# Patient Record
Sex: Male | Born: 1950 | Race: White | Hispanic: No | Marital: Single | Smoking: Current some day smoker
Health system: Southern US, Community
[De-identification: ages and names within clinical notes are randomized; demographics above are authoritative.]

## PROBLEM LIST (undated history)

## (undated) DIAGNOSIS — I10 Essential (primary) hypertension: Principal | ICD-10-CM

## (undated) DIAGNOSIS — E785 Hyperlipidemia, unspecified: Principal | ICD-10-CM

## (undated) DIAGNOSIS — R072 Precordial pain: Secondary | ICD-10-CM

## (undated) HISTORY — DX: Precordial pain: R07.2

## (undated) HISTORY — DX: Hyperlipidemia, unspecified: E78.5

## (undated) HISTORY — DX: Essential (primary) hypertension: I10

---

## 2015-05-04 ENCOUNTER — Ambulatory Visit (INDEPENDENT_AMBULATORY_CARE_PROVIDER_SITE_OTHER): Payer: Self-pay | Admitting: Cardiovascular Disease

## 2015-05-04 ENCOUNTER — Encounter: Payer: Self-pay | Admitting: Cardiovascular Disease

## 2015-05-04 VITALS — BP 162/98 | HR 77 | Ht 67.0 in | Wt 187.4 lb

## 2015-05-04 DIAGNOSIS — R072 Precordial pain: Secondary | ICD-10-CM

## 2015-05-04 DIAGNOSIS — I10 Essential (primary) hypertension: Secondary | ICD-10-CM

## 2015-05-04 DIAGNOSIS — R0602 Shortness of breath: Secondary | ICD-10-CM

## 2015-05-04 HISTORY — DX: Precordial pain: R07.2

## 2015-05-04 HISTORY — DX: Essential (primary) hypertension: I10

## 2015-05-04 MED ORDER — LOSARTAN POTASSIUM 50 MG PO TABS: 50.0000 mg | ORAL_TABLET | Freq: Every day | ORAL | Status: DC

## 2015-05-04 NOTE — Patient Instructions (Signed)
Your physician recommends that you schedule a follow-up appointment in [place].  START LOSARTAN 50 MG ONE TABLET BY MOUTH DAILY   LABS-- CMP ,CBC, LIPID - DO NOT EAT OR DRINK THE MORNING OF TEST.  Your physician has requested that you have en exercise stress myoview. For further information please visit [email or web address]. Please follow instruction sheet, as given.  WILL CONTACT YOU WITH RESULTS.   If you need a refill on your cardiac medications before your next appointment, please call your pharmacy.

## 2015-05-04 NOTE — Progress Notes (Signed)
Cardiology Office Note   Date:  05/04/2015   ID:  Johnny Barnes, DOB March 02, 1951, MRN [id number]  PCP:  Johnny Barnes  Cardiologist:   Sharol Harness, MD   Chief Complaint  Barnes presents with  . New Evaluation    pt c/o sharp chest pain on left side of chest last month/was intermittent over a couple of weeks/ went to Dr. and had very high BP/this past Saturday, was drinking beer, he had a bad pain on the top of his head, went away and came back the next day with some lightheadedness. Has not been as bad since then.//pt states Johnny other Sx.      History of Present Illness: Johnny Barnes is a 64 y.o. male with hypertension who presents for follow up on hypertension.  He has known that his blood presure was high for 15-20 years.  Last month he had an episode of L sided chest pain that was mostly dull but also occasionally sharp.  That continued intermittently for a couple of weeks.  The episodes last for a few seconds at a time and are associated with shortness of breath. There is Johnny associated nausea or diaphoresis. Since then he has checked his blood pressure several times and it has been consistently elevated. He saw his urologist in his blood pressure was 190/90. Since then he tried to cut back on his salt intake and has started feeling much better. However he has a persistent right-sided headache that seems to be worse when he drinks alcohol. He endorses drinking up to a half gallon of beer on the weekends.  He has noted episodes of vertigo that he associates with the headache and hypertension. This caused him to stop drinking for the last week. He states that he has never been on any medication for his blood pressure. Most people in his family have high blood pressure.  Johnny Barnes reports that his diet is pretty good. He does not eat much. He drinks coffee in the morning followed by mostly water throughout the day. In the evenings he has beer. He doesn't get any formal  exercise but does do yard work. He doesn't have any chest pain or pressure when he does this.  Past Medical History  Diagnosis Date  . Essential hypertension 05/04/2015  . Chest pain, precordial 05/04/2015    History reviewed. Johnny pertinent past surgical history.   Current Outpatient Prescriptions  Medication Sig Dispense Refill  . losartan (COZAAR) 50 MG tablet Take 1 tablet (50 mg total) by mouth daily. 30 tablet 6   Johnny current facility-administered medications for this visit.    Allergies:   Aspirin    Social History:  The Barnes  reports that he has been smoking Cigars.  He does not have any smokeless tobacco history on file. He reports that he drinks alcohol. He reports that he uses illicit drugs.   Family History:  The Barnes's family history includes Hypertension in his father and mother.    ROS:  Please see the history of present illness.   Otherwise, review of systems are positive for none.   All other systems are reviewed and negative.    PHYSICAL EXAM: VS:  BP 162/98 mmHg  Pulse 77  Ht 5\' 7"  (1.702 m)  Wt 85.004 kg (187 lb 6.4 oz)  BMI 29.34 kg/m2 , BMI Body mass index is 29.34 kg/(m^2). GENERAL:  Well appearing HEENT:  Pupils equal round and reactive, fundi not visualized, oral mucosa unremarkable  NECK:  Johnny jugular venous distention, waveform within normal limits, carotid upstroke brisk and symmetric, Johnny bruits, Johnny thyromegaly LYMPHATICS:  Johnny cervical adenopathy LUNGS:  Clear to auscultation bilaterally HEART:  RRR.  PMI not displaced or sustained,S1 and S2 within normal limits, Johnny S3, Johnny S4, Johnny clicks, Johnny rubs, Johnny murmurs ABD:  Flat, positive bowel sounds normal in frequency in pitch, Johnny bruits, Johnny rebound, Johnny guarding, Johnny midline pulsatile mass, Johnny hepatomegaly, Johnny splenomegaly EXT:  2 plus pulses throughout, Johnny edema, Johnny cyanosis Johnny clubbing SKIN:  Johnny rashes Johnny nodules NEURO:  Cranial nerves II through XII grossly intact, motor grossly intact  throughout PSYCH:  Cognitively intact, oriented to person place and time    EKG:  EKG is ordered today. The ekg ordered today demonstrates sinus rhythm rate 77 bpm right bundle branch block.   Recent Labs: Johnny results found for requested labs within last 365 days.    Lipid Panel Johnny results found for: CHOL, TRIG, HDL, CHOLHDL, VLDL, LDLCALC, LDLDIRECT    Wt Readings from Last 3 Encounters:  05/04/15 85.004 kg (187 lb 6.4 oz)      ASSESSMENT AND PLAN:  # Chest pain: Johnny Barnes pain is atypical. He does have risk factors including age and untreated hypertension. He also continues to smoke cigars, though he reports that he does not inhale. We will obtain an exercise Myoview to evaluate for ischemia. We will also obtain fasting lipids  # Hypertension:  Blood pressure is elevated. He has failed last modifications and reducing his salt intake. We will start losartan 50 mg daily. We will also check a constant perhaps metabolic panel to assess his kidney function and electrolytes.   # CV Disease Prevention: Check fasting lipid panel.   # EtOH: Johnny Barnes endorses heavy alcohol use. We explained that the recommendations are Johnny more than 2 drinks an ascending 14 one week we will check a comprehensive metabolic panel to assess his liver function.  He denies any history of delirium tremens.  Current medicines are reviewed at length with the Barnes today.  The Barnes does not have concerns regarding medicines.  The following changes have been made:  Start losartan 50 mg daily.  Labs/ tests ordered today include:   Orders Placed This Encounter  Procedures  . CBC  . Comprehensive metabolic panel  . Lipid panel  . Myocardial Perfusion Imaging  . EKG 12-Lead     Disposition:   FU with Anniyah Mood C. Oval Linsey, MD in 1 month to check his blood pressure and electrolytes.    Signed, Sharol Harness, MD  05/04/2015 5:38 PM    [place]

## 2015-05-05 LAB — COMPREHENSIVE METABOLIC PANEL
ALK PHOS: 52 U/L (ref 40–115)
ALT: 20 U/L (ref 9–46)
AST: 19 U/L (ref 10–35)
Albumin: 4.6 g/dL (ref 3.6–5.1)
BUN: 13 mg/dL (ref 7–25)
CO2: 26 mmol/L (ref 20–31)
CREATININE: 0.99 mg/dL (ref 0.70–1.25)
Calcium: 9.5 mg/dL (ref 8.6–10.3)
Chloride: 105 mmol/L (ref 98–110)
Glucose, Bld: 87 mg/dL (ref 65–99)
Potassium: 4.8 mmol/L (ref 3.5–5.3)
SODIUM: 142 mmol/L (ref 135–146)
TOTAL PROTEIN: 7.2 g/dL (ref 6.1–8.1)
Total Bilirubin: 0.8 mg/dL (ref 0.2–1.2)

## 2015-05-05 LAB — LIPID PANEL
CHOLESTEROL: 227 mg/dL — AB (ref 125–200)
HDL: 92 mg/dL (ref >=40)
LDL Cholesterol: 121 mg/dL (ref <130)
Total CHOL/HDL Ratio: 2.5 Ratio (ref <=5.0)
Triglycerides: 70 mg/dL (ref <150)
VLDL: 14 mg/dL (ref <30)

## 2015-05-05 LAB — CBC
HCT: 44.6 % (ref 39.0–52.0)
Hemoglobin: 15.5 g/dL (ref 13.0–17.0)
MCH: 30.9 pg (ref 26.0–34.0)
MCHC: 34.8 g/dL (ref 30.0–36.0)
MCV: 88.8 fL (ref 78.0–100.0)
MPV: 9.6 fL (ref 8.6–12.4)
PLATELETS: 228 10*3/uL (ref 150–400)
RBC: 5.02 MIL/uL (ref 4.22–5.81)
RDW: 13 % (ref 11.5–15.5)
WBC: 6.2 10*3/uL (ref 4.0–10.5)

## 2015-05-09 ENCOUNTER — Telehealth: Payer: Self-pay | Admitting: *Deleted

## 2015-05-09 ENCOUNTER — Telehealth (HOSPITAL_COMMUNITY): Payer: Self-pay

## 2015-05-09 DIAGNOSIS — E785 Hyperlipidemia, unspecified: Secondary | ICD-10-CM

## 2015-05-09 DIAGNOSIS — Z79899 Other long term (current) drug therapy: Secondary | ICD-10-CM

## 2015-05-09 NOTE — Telephone Encounter (Signed)
Encounter complete. 

## 2015-05-09 NOTE — Telephone Encounter (Signed)
-----   Message from Skeet Latch, MD sent at 05/09/2015  7:52 AM EST ----- Kidney function, liver function, electrolytes and blood counts are normal.  However, cholesterol is elevated.  I recommend increased physical activity and improved diets (decrease fried, fatty foods, red meat, increase green, leafy vegetables and fruits).  We will repeat this in 3 months and if still elevated, we should start a cholesterol medication.

## 2015-05-09 NOTE — Telephone Encounter (Signed)
LEFT MESSAGE TO CALL BACK- LABS

## 2015-05-09 NOTE — Telephone Encounter (Signed)
Spoke to patient. Result given . Verbalized understanding WILL MAIL LABSLIP IN JAN 2017

## 2015-05-10 ENCOUNTER — Telehealth (HOSPITAL_COMMUNITY): Payer: Self-pay

## 2015-05-10 NOTE — Telephone Encounter (Signed)
Encounter complete. 

## 2015-05-11 ENCOUNTER — Ambulatory Visit (HOSPITAL_COMMUNITY)
Admission: RE | Admit: 2015-05-11 | Discharge: 2015-05-11 | Disposition: A | Discharge status: Home / Self Care | Payer: Self-pay | Source: Ambulatory Visit | Attending: Cardiology | Admitting: Cardiology

## 2015-05-11 DIAGNOSIS — I10 Essential (primary) hypertension: Secondary | ICD-10-CM

## 2015-05-11 DIAGNOSIS — R0602 Shortness of breath: Secondary | ICD-10-CM

## 2015-05-11 DIAGNOSIS — R072 Precordial pain: Secondary | ICD-10-CM

## 2015-05-11 LAB — MYOCARDIAL PERFUSION IMAGING
CHL CUP NUCLEAR SDS: 1
CHL CUP NUCLEAR SRS: 3
CHL CUP NUCLEAR SSS: 4
CSEPHR: 94 %
CSEPPHR: 148 {beats}/min
Estimated workload: 7 METS
Exercise duration (min): 6 min
Exercise duration (sec): 37 s
LVDIAVOL: 103 mL
LVSYSVOL: 43 mL
MPHR: 157 {beats}/min
NUC STRESS TID: 0.89
RPE: 17
Rest HR: 64 {beats}/min

## 2015-05-11 MED ORDER — TECHNETIUM TC 99M SESTAMIBI GENERIC - CARDIOLITE
11.0000 | Freq: Once | INTRAVENOUS | Status: AC | PRN
  Administered 2015-05-11: 11 via INTRAVENOUS

## 2015-05-11 MED ORDER — TECHNETIUM TC 99M SESTAMIBI GENERIC - CARDIOLITE
32.6000 | Freq: Once | INTRAVENOUS | Status: AC | PRN
  Administered 2015-05-11: 32.6 via INTRAVENOUS

## 2015-05-15 ENCOUNTER — Ambulatory Visit: Payer: Self-pay | Admitting: Cardiovascular Disease

## 2015-05-16 ENCOUNTER — Telehealth: Payer: Self-pay | Admitting: *Deleted

## 2015-05-16 NOTE — Telephone Encounter (Signed)
-----   Message from Skeet Latch, MD sent at 05/13/2015  6:53 AM EST ----- Low risk study.

## 2015-05-16 NOTE — Telephone Encounter (Signed)
Spoke to patient. Result given . Verbalized understanding  

## 2015-06-13 ENCOUNTER — Encounter: Payer: Self-pay | Admitting: Cardiovascular Disease

## 2015-06-13 ENCOUNTER — Ambulatory Visit (INDEPENDENT_AMBULATORY_CARE_PROVIDER_SITE_OTHER): Payer: Self-pay | Admitting: Cardiovascular Disease

## 2015-06-13 VITALS — BP 140/86 | HR 88 | Ht 67.0 in | Wt 180.9 lb

## 2015-06-13 DIAGNOSIS — E785 Hyperlipidemia, unspecified: Secondary | ICD-10-CM

## 2015-06-13 DIAGNOSIS — I10 Essential (primary) hypertension: Secondary | ICD-10-CM

## 2015-06-13 HISTORY — DX: Hyperlipidemia, unspecified: E78.5

## 2015-06-13 NOTE — Progress Notes (Signed)
Cardiology Office Note   Date:  06/13/2015   ID:  Johnny Barnes, DOB 03/02/1951, MRN [id number]  PCP:  No PCP Per Patient  Cardiologist:   Sharol Harness, MD   Chief Complaint  Patient presents with  . Follow-up    pt has no complaints   . Hypertension     Patient ID: Johnny Barnes is a 64 y.o. male with hypertension who presents for follow up on hypertension.     Interval History 06/13/15: Johnny Barnes underwent nuclear stress testing that was negative for ischemia. It did show a hypertensive response to exercise.  It also showed a fixed apical septal and apical inferior wall defect that were thought to be defect from right bundle branch block.   At his last appointment it was also recommended that he increase his physical activity and improve his diet, as his cholesterol levels were elevated.  He was started on losartan for hypertension.  Since that appointment he has been limiting salt intake and he quit drinking alcohol.  He denies any recurrent chest pain or any shortness of breath.  He has made other improvements in his diet including eating raw nuts,vegetables, and fruits. He has not started exercising yet. When the weather is warm he likes to work in the yard. He has not been any formal exercise lately.  He has been tolerating losartan without complications.   History of Present Illness 05/04/15: He has known that his blood presure was high for 15-20 years.  Last month he had an episode of L sided chest pain that was mostly dull but also occasionally sharp.  That continued intermittently for a couple of weeks.  The episodes last for a few seconds at a time and are associated with shortness of breath. There is no associated nausea or diaphoresis. Since then he has checked his blood pressure several times and it has been consistently elevated. He saw his urologist in his blood pressure was 190/90. Since then he tried to cut back on his salt intake and has started feeling much  better. However he has a persistent right-sided headache that seems to be worse when he drinks alcohol. He endorses drinking up to a half gallon of beer on the weekends.  He has noted episodes of vertigo that he associates with the headache and hypertension. This caused him to stop drinking for the last week. He states that he has never been on any medication for his blood pressure. Most people in his family have high blood pressure.  Johnny Barnes reports that his diet is pretty good. He does not eat much. He drinks coffee in the morning followed by mostly water throughout the day. In the evenings he has beer. He doesn't get any formal exercise but does do yard work. He doesn't have any chest pain or pressure when he does this.  Past Medical History  Diagnosis Date  . Essential hypertension 05/04/2015  . Chest pain, precordial 05/04/2015  . Hyperlipidemia 06/13/2015    No past surgical history on file.   Current Outpatient Prescriptions  Medication Sig Dispense Refill  . losartan (COZAAR) 50 MG tablet Take 1 tablet (50 mg total) by mouth daily. 30 tablet 6   No current facility-administered medications for this visit.    Allergies:   Aspirin    Social History:  The patient  reports that he has been smoking Cigars.  He does not have any smokeless tobacco history on file. He reports that he drinks alcohol. He  reports that he uses illicit drugs.   Family History:  The patient's family history includes Hypertension in his father and mother.    ROS:  Please see the history of present illness.   Otherwise, review of systems are positive for none.   All other systems are reviewed and negative.    PHYSICAL EXAM: VS:  BP 140/86 mmHg  Pulse 88  Ht 5\' 7"  (1.702 m)  Wt 82.056 kg (180 lb 14.4 oz)  BMI 28.33 kg/m2 , BMI Body mass index is 28.33 kg/(m^2). GENERAL:  Well appearing HEENT:  Pupils equal round and reactive, fundi not visualized, oral mucosa unremarkable NECK:  No jugular venous  distention, waveform within normal limits, carotid upstroke brisk and symmetric, no bruits, no thyromegaly LYMPHATICS:  No cervical adenopathy LUNGS:  Clear to auscultation bilaterally HEART:  RRR.  PMI not displaced or sustained,S1 and S2 within normal limits, no S3, no S4, no clicks, no rubs, no murmurs ABD:  Flat, positive bowel sounds normal in frequency in pitch, no bruits, no rebound, no guarding, no midline pulsatile mass, no hepatomegaly, no splenomegaly EXT:  2 plus pulses throughout, no edema, no cyanosis no clubbing SKIN:  No rashes no nodules NEURO:  Cranial nerves II through XII grossly intact, motor grossly intact throughout PSYCH:  Cognitively intact, oriented to person place and time    EKG:  EKG is not ordered today.  Exercise Cardiolite 05/11/15:   Nuclear stress EF: 58%.  The left ventricular ejection fraction is normal (55-65%).  Blood pressure demonstrated a hypertensive response to exercise.  There was no ST segment deviation noted during stress.  No T wave inversion was noted during stress.  Small size and intensity, fixed apical septal and apical inferior wall defect, likely artifact from RBBB. No reversible ischemia. LVEF 58%. This is a low risk study.  Recent Labs: 05/04/2015: ALT 20; BUN 13; Creat 0.99; Hemoglobin 15.5; Platelets 228; Potassium 4.8; Sodium 142    Lipid Panel    Component Value Date/Time   CHOL 227* 05/04/2015 0919   TRIG 70 05/04/2015 0919   HDL 92 05/04/2015 0919   CHOLHDL 2.5 05/04/2015 0919   VLDL 14 05/04/2015 0919   LDLCALC 121 05/04/2015 0919      Wt Readings from Last 3 Encounters:  06/13/15 82.056 kg (180 lb 14.4 oz)  05/11/15 84.823 kg (187 lb)  05/04/15 85.004 kg (187 lb 6.4 oz)      ASSESSMENT AND PLAN:  # Hypertension:  Blood pressure is well-controlled today.  He has been limiting salt intake and no longer drinks alcohol.  Continue losartan 50 mg daily.  # Chest pain: Negative stress test.  Symptoms have  resolved.  # CV Disease Prevention: ASCVD 10 year risk is 11.2%.  He is working on diet and exercise.  He is doing well on her diet but has yet to start exercising. We will repeat his fasting lipid panel in February. If his lipids remain elevated we will starta moderate to high intensity statin.  He has been intolerant of aspirin in the past, as it caused his stomach to burn.  # EtOH: Mr. Constantinides previously endorsed heavy alcohol use. He has been abstaining from alcohol since our last appointment.  He was congratulated on his efforts.  Current medicines are reviewed at length with the patient today.  The patient does not have concerns regarding medicines.  The following changes have been made:  None  Labs/ tests ordered today include:   No orders of the  defined types were placed in this encounter.     Disposition:   FU with Brynlie Daza C. Oval Linsey, MD in 1 year.  Lipid panel in 2 months.   Signed, Sharol Harness, MD  06/13/2015 2:05 PM    [place]

## 2015-06-13 NOTE — Patient Instructions (Signed)
NO CHANGE WITH CURRENT TREATMENT   WILL MAIL LABSLIP IN JAN 2017 FOR LAB IN FEB 2017    Your physician wants you to follow-up in 12 MONTHS DR Strang.  You will receive a reminder letter in the mail two months in advance. If you don't receive a letter, please call our office to schedule the follow-up appointment.   If you need a refill on your cardiac medications before your next appointment, please call your pharmacy.

## 2015-07-17 ENCOUNTER — Telehealth: Payer: Self-pay | Admitting: *Deleted

## 2015-07-17 DIAGNOSIS — Z79899 Other long term (current) drug therapy: Secondary | ICD-10-CM

## 2015-07-17 DIAGNOSIS — E785 Hyperlipidemia, unspecified: Secondary | ICD-10-CM

## 2015-07-17 NOTE — Telephone Encounter (Signed)
MAIL LETTER AND LAB SLIP DUE BEFORE 08/04/15

## 2015-07-17 NOTE — Telephone Encounter (Signed)
-----   Message from Raiford Simmonds, RN sent at 05/09/2015  3:31 PM EST ----- MAIL IN [place] 2017 LIPID

## 2015-08-04 LAB — LIPID PANEL
CHOL/HDL RATIO: 3.6 ratio (ref <=5.0)
Cholesterol: 247 mg/dL — ABNORMAL HIGH (ref 125–200)
HDL: 69 mg/dL (ref >=40)
LDL CALC: 164 mg/dL — AB (ref <130)
Triglycerides: 68 mg/dL (ref <150)
VLDL: 14 mg/dL (ref <30)

## 2015-08-11 ENCOUNTER — Telehealth: Payer: Self-pay | Admitting: *Deleted

## 2015-08-11 DIAGNOSIS — E785 Hyperlipidemia, unspecified: Secondary | ICD-10-CM

## 2015-08-11 DIAGNOSIS — Z79899 Other long term (current) drug therapy: Secondary | ICD-10-CM

## 2015-08-11 NOTE — Telephone Encounter (Signed)
Left message to call back In regards to lab work

## 2015-08-11 NOTE — Telephone Encounter (Signed)
-----   Message from Skeet Latch, MD sent at 08/09/2015  7:04 PM EST ----- Cholesterol levels are worse now than 3 months ago.  Recommend starting atorvastatin 40 mg daily.  Repeat lipids and LFTs in 6 weeks.

## 2015-08-11 NOTE — Telephone Encounter (Signed)
Spoke to patient Result given. Patient verbalizing understanding. Patient states he does not want to take atorvastatin at present time. Would to repeat prior to next appointment in 05/2016. Will mail lab slip

## 2015-08-16 ENCOUNTER — Telehealth: Payer: Self-pay | Admitting: Cardiovascular Disease

## 2015-08-16 DIAGNOSIS — E785 Hyperlipidemia, unspecified: Secondary | ICD-10-CM

## 2015-08-16 DIAGNOSIS — Z79899 Other long term (current) drug therapy: Secondary | ICD-10-CM

## 2015-08-16 MED ORDER — ATORVASTATIN CALCIUM 40 MG PO TABS: 40.0000 mg | ORAL_TABLET | Freq: Every day | ORAL | Status: DC

## 2015-08-16 NOTE — Telephone Encounter (Signed)
Spoke to patient  He decide to start  Atorvastatin 40 mg qhs and labs 6 weeks labs  patient aware and will have done in 6 weeks

## 2015-08-16 NOTE — Telephone Encounter (Signed)
Pt says he have decided to take the Cholesterol medicine.

## 2015-08-24 ENCOUNTER — Telehealth: Payer: Self-pay | Admitting: Cardiovascular Disease

## 2015-08-24 NOTE — Telephone Encounter (Signed)
Please schedule follow up in 2 months.  Repeat lipids 2-7 days prior to his follow up appointment.

## 2015-08-24 NOTE — Telephone Encounter (Signed)
Spoke with pt, he is not going to start the new cholesterol, he does not like the side effects. He reports he is going to make additional changes to his diet. He wants to know when to do lab work again. Will forward to dr Oval Linsey and Ivin Booty

## 2015-08-24 NOTE — Telephone Encounter (Signed)
New message ° ° ° ° ° °Talk to the nurse.  He would not tell me what he wanted °

## 2015-08-25 NOTE — Telephone Encounter (Signed)
Spoke to patient  Will try diet control Labs will be done latter part of April Appointment schedule 10/26/15 at 1:30 pm

## 2015-10-20 LAB — HEPATIC FUNCTION PANEL
ALBUMIN: 4.3 g/dL (ref 3.6–5.1)
ALK PHOS: 54 U/L (ref 40–115)
ALT: 13 U/L (ref 9–46)
AST: 14 U/L (ref 10–35)
Bilirubin, Direct: 0.1 mg/dL (ref <=0.2)
Indirect Bilirubin: 0.5 mg/dL (ref 0.2–1.2)
TOTAL PROTEIN: 7 g/dL (ref 6.1–8.1)
Total Bilirubin: 0.6 mg/dL (ref 0.2–1.2)

## 2015-10-20 LAB — LIPID PANEL
Cholesterol: 207 mg/dL — ABNORMAL HIGH (ref 125–200)
HDL: 66 mg/dL (ref >=40)
LDL Cholesterol: 128 mg/dL (ref <130)
TRIGLYCERIDES: 67 mg/dL (ref <150)
Total CHOL/HDL Ratio: 3.1 Ratio (ref <=5.0)
VLDL: 13 mg/dL (ref <30)

## 2015-10-25 NOTE — Progress Notes (Signed)
Cardiology Office Note   Date:  10/26/2015   ID:  Johnny Barnes, DOB 02-10-1951, MRN [id number]  PCP:  No PCP Per Patient  Cardiologist:   Skeet Latch, MD   Chief Complaint  Patient presents with  . Follow-up    labs  pt states he has not been sleeping well--takes about 2 hours to go to sleep at night, not sure if from losartan, not taking lipitor     History of Present Illness: Johnny Barnes is a 65 y.o. male with hypertension who presents for follow up on hypertension.  He has known that his blood presure was high for 15-20 years.  Mr. Pickell noted chest discomfort and underwent nuclear stress testing 05/11/15 that was negative for ischemia. It did show a hypertensive response to exercise.  He was started on losartan and we recommended lifestyle interventions.  He previously drank alcohol heavily but started to abstain from alcohol. Cholesterol levels were checked after 3 months of lifestyle interventions. However his cholesterol levels were worse so it was recommended that he start a statin.  However, he declined.  Mr. Tworek has been doing well.  He has not noted any chest pain or shortness of breath.  He denies lower extremity edema, orthopnea or PND.  He has been working on dietary changes but has not been exercising.  He has continued to abstain from alcohol.  He smokes one cigar daily, though he does not consider this to be smoking, as he does not inhale.  Mr. Haecker notes difficulty falling asleep and wonders if this may be due to the losartan.  Past Medical History  Diagnosis Date  . Essential hypertension 05/04/2015  . Chest pain, precordial 05/04/2015  . Hyperlipidemia 06/13/2015    No past surgical history on file.   Current Outpatient Prescriptions  Medication Sig Dispense Refill  . losartan (COZAAR) 50 MG tablet Take 1 tablet (50 mg total) by mouth daily. 30 tablet 6   No current facility-administered medications for this visit.    Allergies:    Aspirin    Social History:  The patient  reports that he has been smoking Cigars.  He does not have any smokeless tobacco history on file. He reports that he drinks alcohol. He reports that he uses illicit drugs.   Family History:  The patient's family history includes Hypertension in his father and mother.    ROS:  Please see the history of present illness.   Otherwise, review of systems are positive for none.   All other systems are reviewed and negative.    PHYSICAL EXAM: VS:  BP 140/92 mmHg  Pulse 70  Ht 5\' 7"  (1.702 m)  Wt 76.295 kg (168 lb 3.2 oz)  BMI 26.34 kg/m2 , BMI Body mass index is 26.34 kg/(m^2). GENERAL:  Well appearing HEENT:  Pupils equal round and reactive, fundi not visualized, oral mucosa unremarkable NECK:  No jugular venous distention, waveform within normal limits, carotid upstroke brisk and symmetric, no bruits, no thyromegaly LYMPHATICS:  No cervical adenopathy LUNGS:  Clear to auscultation bilaterally HEART:  RRR.  PMI not displaced or sustained,S1 and S2 within normal limits, no S3, no S4, no clicks, no rubs, no murmurs ABD:  Flat, positive bowel sounds normal in frequency in pitch, no bruits, no rebound, no guarding, no midline pulsatile mass, no hepatomegaly, no splenomegaly EXT:  2 plus pulses throughout, no edema, no cyanosis no clubbing SKIN:  No rashes no nodules NEURO:  Cranial nerves II through XII grossly  intact, motor grossly intact throughout PSYCH:  Cognitively intact, oriented to person place and time  EKG:  EKG is not ordered today.  Exercise Cardiolite 05/11/15:   Nuclear stress EF: 58%.  The left ventricular ejection fraction is normal (55-65%).  Blood pressure demonstrated a hypertensive response to exercise.  There was no ST segment deviation noted during stress.  No T wave inversion was noted during stress.  Small size and intensity, fixed apical septal and apical inferior wall defect, likely artifact from RBBB. No reversible  ischemia. LVEF 58%. This is a low risk study.  Recent Labs: 05/04/2015: BUN 13; Creat 0.99; Hemoglobin 15.5; Platelets 228; Potassium 4.8; Sodium 142 10/19/2015: ALT 13    Lipid Panel    Component Value Date/Time   CHOL 207* 10/19/2015 0821   TRIG 67 10/19/2015 0821   HDL 66 10/19/2015 0821   CHOLHDL 3.1 10/19/2015 0821   VLDL 13 10/19/2015 0821   LDLCALC 128 10/19/2015 0821      Wt Readings from Last 3 Encounters:  10/26/15 76.295 kg (168 lb 3.2 oz)  06/13/15 82.056 kg (180 lb 14.4 oz)  05/11/15 84.823 kg (187 lb)      ASSESSMENT AND PLAN:  # Hypertension:  Blood pressure is slightly above goal.  He prefers to continue working on dietary interventions rather than adjusting his medication.  Continue losartan 50 mg daily.  # Hyperlipidemia: Cholesterol levels remain elevated but significantly improved from prior. I congratulated him on his dietary interventions. I continue to recommend a statin that he would rather continue working on diet and increase his exercise. We will repeat lipids in 6 months.    Current medicines are reviewed at length with the patient today.  The patient does not have concerns regarding medicines.  The following changes have been made:  None  Labs/ tests ordered today include:   No orders of the defined types were placed in this encounter.     Disposition:   FU with Lakesia Dahle C. Oval Linsey, MD in 1 year.  Lipid panel in 6 months.   Signed, Skeet Latch, MD  10/26/2015 1:55 PM    [facility]

## 2015-10-26 ENCOUNTER — Encounter: Payer: Self-pay | Admitting: Cardiovascular Disease

## 2015-10-26 ENCOUNTER — Ambulatory Visit (INDEPENDENT_AMBULATORY_CARE_PROVIDER_SITE_OTHER): Payer: Self-pay | Admitting: Cardiovascular Disease

## 2015-10-26 VITALS — BP 140/92 | HR 70 | Ht 67.0 in | Wt 168.2 lb

## 2015-10-26 DIAGNOSIS — Z720 Tobacco use: Secondary | ICD-10-CM

## 2015-10-26 DIAGNOSIS — E785 Hyperlipidemia, unspecified: Secondary | ICD-10-CM

## 2015-10-26 DIAGNOSIS — I10 Essential (primary) hypertension: Secondary | ICD-10-CM

## 2015-10-26 NOTE — Patient Instructions (Signed)
Medication Instructions:  Your physician recommends that you continue on your current medications as directed. Please refer to the Current Medication list given to you today.  Labwork: FASTING LABS IN 6 MONTHS  Testing/Procedures: NONE  Follow-Up: Your physician wants you to follow-up in: [place] will receive a reminder letter in the mail two months in advance. If you don't receive a letter, please call our office to schedule the follow-up appointment.  If you need a refill on your cardiac medications before your next appointment, please call your pharmacy.

## 2015-11-29 ENCOUNTER — Telehealth: Payer: Self-pay | Admitting: *Deleted

## 2015-11-29 DIAGNOSIS — E785 Hyperlipidemia, unspecified: Secondary | ICD-10-CM

## 2015-11-29 NOTE — Telephone Encounter (Signed)
Follow up labs put in Epic and mailed to patient Needs to get first of November

## 2015-12-01 ENCOUNTER — Other Ambulatory Visit: Payer: Self-pay | Admitting: Cardiovascular Disease

## 2015-12-01 MED ORDER — LOSARTAN POTASSIUM 50 MG PO TABS: 50.0000 mg | ORAL_TABLET | Freq: Every day | ORAL | Status: DC

## 2015-12-01 NOTE — Telephone Encounter (Signed)
Rx request sent to pharmacy.  

## 2016-02-26 IMAGING — NM NM MISC PROCEDURE
6 series · 36 of 36 positions shown · non-contrast
Comparison: none

[Series 1: wbr rest · 6.40mm/px · 6 of 64 frames shown]
[frame 6/64]
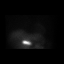
[frame 16/64]
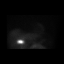
[frame 27/64]
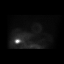
[frame 38/64]
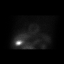
[frame 48/64]
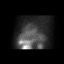
[frame 59/64]
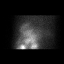

[Series 1: wbr_r-proj_st wbr rest · 6.40mm/px · 6 of 64 frames shown]
[frame 6/64]
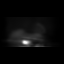
[frame 16/64]
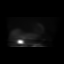
[frame 27/64]
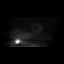
[frame 38/64]
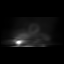
[frame 48/64]
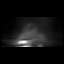
[frame 59/64]
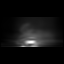

[Series 2: wbr stress-gsp · 6.40mm/px · 6 of 512 frames shown]
[frame 43/512]
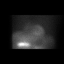
[frame 128/512]
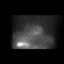
[frame 214/512]
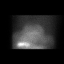
[frame 299/512]
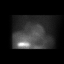
[frame 384/512]
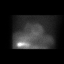
[frame 470/512]
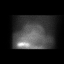

[Series 2: wbr_s-proj_st wbr stress-gsp · 6.40mm/px · 6 of 512 frames shown]
[frame 43/512]
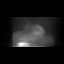
[frame 128/512]
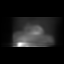
[frame 214/512]
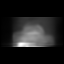
[frame 299/512]
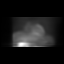
[frame 384/512]
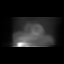
[frame 470/512]
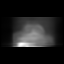

[Series 3: wbr stress-sum-em · 6.40mm/px · 6 of 64 frames shown]
[frame 6/64]
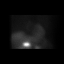
[frame 16/64]
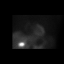
[frame 27/64]
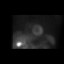
[frame 38/64]
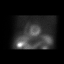
[frame 48/64]
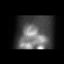
[frame 59/64]
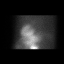

[Series 3: wbr_s-proj_st wbr stress-sum-em · 6.40mm/px · 6 of 64 frames shown]
[frame 6/64]
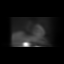
[frame 16/64]
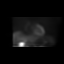
[frame 27/64]
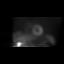
[frame 38/64]
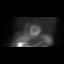
[frame 48/64]
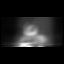
[frame 59/64]
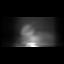

[36 of 36 positions shown; findings below may reference images not displayed]

Canned report from images found in remote index.

Refer to host system for actual result text.

## 2016-05-03 ENCOUNTER — Telehealth: Payer: Self-pay | Admitting: *Deleted

## 2016-05-03 DIAGNOSIS — Z79899 Other long term (current) drug therapy: Secondary | ICD-10-CM

## 2016-05-03 DIAGNOSIS — E785 Hyperlipidemia, unspecified: Secondary | ICD-10-CM

## 2016-05-03 NOTE — Telephone Encounter (Signed)
Mailed lab slip and letter 

## 2016-05-03 NOTE — Telephone Encounter (Signed)
-----   Message from Raiford Simmonds, RN sent at 08/11/2015  3:56 PM EST ----- Hepatic ,lipid  Due by 05/14/16

## 2016-05-14 LAB — HEPATIC FUNCTION PANEL
ALT: 13 U/L (ref 9–46)
AST: 16 U/L (ref 10–35)
Albumin: 4.4 g/dL (ref 3.6–5.1)
Alkaline Phosphatase: 52 U/L (ref 40–115)
BILIRUBIN DIRECT: 0.1 mg/dL (ref <=0.2)
BILIRUBIN INDIRECT: 0.6 mg/dL (ref 0.2–1.2)
TOTAL PROTEIN: 6.6 g/dL (ref 6.1–8.1)
Total Bilirubin: 0.7 mg/dL (ref 0.2–1.2)

## 2016-05-14 LAB — LIPID PANEL
CHOL/HDL RATIO: 3 ratio (ref <5.0)
CHOLESTEROL: 236 mg/dL — AB (ref <200)
HDL: 78 mg/dL (ref >40)
LDL Cholesterol: 144 mg/dL — ABNORMAL HIGH (ref <100)
Triglycerides: 68 mg/dL (ref <150)
VLDL: 14 mg/dL (ref <30)

## 2016-06-12 ENCOUNTER — Encounter: Payer: Self-pay | Admitting: Cardiovascular Disease

## 2016-06-12 ENCOUNTER — Ambulatory Visit (INDEPENDENT_AMBULATORY_CARE_PROVIDER_SITE_OTHER): Payer: Self-pay | Admitting: Cardiovascular Disease

## 2016-06-12 VITALS — BP 134/74 | HR 72 | Ht 67.0 in | Wt 165.2 lb

## 2016-06-12 DIAGNOSIS — Z79899 Other long term (current) drug therapy: Secondary | ICD-10-CM

## 2016-06-12 DIAGNOSIS — E785 Hyperlipidemia, unspecified: Secondary | ICD-10-CM

## 2016-06-12 DIAGNOSIS — I10 Essential (primary) hypertension: Secondary | ICD-10-CM

## 2016-06-12 MED ORDER — ATORVASTATIN CALCIUM 20 MG PO TABS: 20.0000 mg | ORAL_TABLET | Freq: Every day | ORAL | 3 refills | Status: DC

## 2016-06-12 NOTE — Progress Notes (Signed)
Cardiology Office Note   Date:  06/19/2016   ID:  Johnny Barnes, DOB December 17, 1950, MRN [id number]  PCP:  No PCP Per Patient  Cardiologist:   Skeet Latch, MD   Chief Complaint  Patient presents with  . Follow-up  . Leg Pain    at night.     History of Present Illness: Johnny Barnes is a 65 y.o. male with hypertension who presents for follow up.  He has known that his blood presure was high for 15-20 years.  Johnny Barnes noted chest discomfort and underwent nuclear stress testing 05/11/15 that was negative for ischemia. It did show a hypertensive response to exercise.  He was started on losartan and we recommended lifestyle interventions.  He previously drank alcohol heavily but started to abstain from alcohol. Cholesterol levels were checked after 3 months of lifestyle interventions. However his cholesterol levels were worse so it was recommended that he start a statin.  However, he declined.  Since his last appointment Johnny Barnes has been doing well.  He has not been exercising regularly and notes some dietary indiscretion over the holidays.  He thinks this is why his cholesterol is elevated again.  He denies chest pain, shortness of breath, lower extremity edema, orthopnea or PND.   Past Medical History:  Diagnosis Date  . Chest pain, precordial 05/04/2015  . Essential hypertension 05/04/2015  . Hyperlipidemia 06/13/2015    No past surgical history on file.   Current Outpatient Prescriptions  Medication Sig Dispense Refill  . losartan (COZAAR) 50 MG tablet Take 1 tablet (50 mg total) by mouth daily. 30 tablet 12  . tamsulosin (FLOMAX) 0.4 MG CAPS capsule Take 0.4 mg by mouth.    [name] atorvastatin (LIPITOR) 20 MG tablet Take 1 tablet (20 mg total) by mouth daily. 90 tablet 3   No current facility-administered medications for this visit.     Allergies:   Aspirin   Social History:  The patient  reports that he has been smoking Cigars.  He does not have any smokeless  tobacco history on file. He reports that he drinks alcohol. He reports that he uses drugs.   Family History:  The patient's family history includes Hypertension in his father and mother.    ROS:  Please see the history of present illness.   Otherwise, review of systems are positive for none.   All other systems are reviewed and negative.    PHYSICAL EXAM: VS:  BP 134/74   Pulse 72   Ht 5\' 7"  (1.702 m)   Wt 74.9 kg (165 lb 3.2 oz)   BMI 25.87 kg/m  , BMI Body mass index is 25.87 kg/m. GENERAL:  Well appearing HEENT:  Pupils equal round and reactive, fundi not visualized, oral mucosa unremarkable NECK:  No jugular venous distention, waveform within normal limits, carotid upstroke brisk and symmetric, no bruits LYMPHATICS:  No cervical adenopathy LUNGS:  Clear to auscultation bilaterally HEART:  RRR.  PMI not displaced or sustained,S1 and S2 within normal limits, no S3, no S4, no clicks, no rubs, no murmurs ABD:  Flat, positive bowel sounds normal in frequency in pitch, no bruits, no rebound, no guarding, no midline pulsatile mass, no hepatomegaly, no splenomegaly EXT:  2 plus pulses throughout, no edema, no cyanosis no clubbing SKIN:  No rashes no nodules NEURO:  Cranial nerves II through XII grossly intact, motor grossly intact throughout PSYCH:  Cognitively intact, oriented to person place and time  EKG:  EKG is ordered today.  06/12/16: Sinus rhythm.  Rate 65 bpm.  RBBB.   Exercise Cardiolite 05/11/15:   Nuclear stress EF: 58%.  The left ventricular ejection fraction is normal (55-65%).  Blood pressure demonstrated a hypertensive response to exercise.  There was no ST segment deviation noted during stress.  No T wave inversion was noted during stress.  Small size and intensity, fixed apical septal and apical inferior wall defect, likely artifact from RBBB. No reversible ischemia. LVEF 58%. This is a low risk study.  Recent Labs: 05/13/2016: ALT 13    Lipid Panel      Component Value Date/Time   CHOL 236 (H) 05/13/2016 0809   TRIG 68 05/13/2016 0809   HDL 78 05/13/2016 0809   CHOLHDL 3.0 05/13/2016 0809   VLDL 14 05/13/2016 0809   LDLCALC 144 (H) 05/13/2016 0809      Wt Readings from Last 3 Encounters:  06/12/16 74.9 kg (165 lb 3.2 oz)  10/26/15 76.3 kg (168 lb 3.2 oz)  06/13/15 82.1 kg (180 lb 14.4 oz)      ASSESSMENT AND PLAN:  # Hypertension:  Blood pressure is above goal.  He prefers to continue working on dietary interventions rather than adjusting his medication.  Continue losartan 50 mg daily.  # Hyperlipidemia: Cholesterol levels are back up and he is struggling with regular diet and exercise.  He is willing to try atorvastatin 20mg  daily.  We will repeat lipids and CMP in 2 months.   Current medicines are reviewed at length with the patient today.  The patient does not have concerns regarding medicines.  The following changes have been made:  None  Labs/ tests ordered today include:   Orders Placed This Encounter  Procedures  . Lipid Profile  . COMPLETE METABOLIC PANEL WITH GFR  . EKG 12-Lead     Disposition:   FU with Johnny Vanaken C. Oval Linsey, MD in 6 months.   Signed, Skeet Latch, MD  06/19/2016 1:40 PM    [place]

## 2016-06-12 NOTE — Patient Instructions (Signed)
Medication Instructions:  Begin taking atorvastatin 20mg  once daily.  Labwork: Return for blood work (Lipids, [place]) in February.  Testing/Procedures: None  Follow-Up: Your physician wants you to follow-up in: 6 months. You will receive a reminder letter in the mail two months in advance. If you don't receive a letter, please call our office to schedule the follow-up appointment.    If you need a refill on your cardiac medications before your next appointment, please call your pharmacy.

## 2016-07-26 DIAGNOSIS — Z79899 Other long term (current) drug therapy: Secondary | ICD-10-CM | POA: Diagnosis not present

## 2016-07-26 DIAGNOSIS — E785 Hyperlipidemia, unspecified: Secondary | ICD-10-CM | POA: Diagnosis not present

## 2016-07-26 LAB — LIPID PANEL
Cholesterol: 138 mg/dL (ref <200)
HDL: 76 mg/dL (ref >40)
LDL CALC: 53 mg/dL (ref <100)
Total CHOL/HDL Ratio: 1.8 Ratio (ref <5.0)
Triglycerides: 47 mg/dL (ref <150)
VLDL: 9 mg/dL (ref <30)

## 2016-07-26 LAB — COMPLETE METABOLIC PANEL WITH GFR
ALT: 24 U/L (ref 9–46)
AST: 20 U/L (ref 10–35)
Albumin: 4.5 g/dL (ref 3.6–5.1)
Alkaline Phosphatase: 54 U/L (ref 40–115)
BUN: 16 mg/dL (ref 7–25)
CO2: 28 mmol/L (ref 20–31)
Calcium: 9.3 mg/dL (ref 8.6–10.3)
Chloride: 104 mmol/L (ref 98–110)
Creat: 0.92 mg/dL (ref 0.70–1.25)
GFR, EST NON AFRICAN AMERICAN: 87 mL/min (ref >=60)
GFR, Est African American: >89 mL/min (ref >=60)
GLUCOSE: 84 mg/dL (ref 65–99)
POTASSIUM: 4.2 mmol/L (ref 3.5–5.3)
SODIUM: 139 mmol/L (ref 135–146)
TOTAL PROTEIN: 6.9 g/dL (ref 6.1–8.1)
Total Bilirubin: 0.9 mg/dL (ref 0.2–1.2)

## 2016-08-02 DIAGNOSIS — R310 Gross hematuria: Secondary | ICD-10-CM | POA: Diagnosis not present

## 2016-08-05 DIAGNOSIS — N401 Enlarged prostate with lower urinary tract symptoms: Secondary | ICD-10-CM | POA: Diagnosis not present

## 2016-08-05 DIAGNOSIS — R351 Nocturia: Secondary | ICD-10-CM | POA: Diagnosis not present

## 2016-08-05 DIAGNOSIS — R310 Gross hematuria: Secondary | ICD-10-CM | POA: Diagnosis not present

## 2016-08-22 ENCOUNTER — Other Ambulatory Visit: Payer: Self-pay

## 2016-08-22 MED ORDER — ATORVASTATIN CALCIUM 20 MG PO TABS: 20.0000 mg | ORAL_TABLET | Freq: Every day | ORAL | 0 refills | Status: DC

## 2016-09-23 ENCOUNTER — Other Ambulatory Visit: Payer: Self-pay | Admitting: *Deleted

## 2016-09-23 MED ORDER — LOSARTAN POTASSIUM 50 MG PO TABS: 50.0000 mg | ORAL_TABLET | Freq: Every day | ORAL | 2 refills | Status: DC

## 2016-09-23 MED ORDER — ATORVASTATIN CALCIUM 20 MG PO TABS: 20.0000 mg | ORAL_TABLET | Freq: Every day | ORAL | 1 refills | Status: DC

## 2016-10-17 DIAGNOSIS — C44311 Basal cell carcinoma of skin of nose: Secondary | ICD-10-CM | POA: Diagnosis not present

## 2016-10-17 DIAGNOSIS — L570 Actinic keratosis: Secondary | ICD-10-CM | POA: Diagnosis not present

## 2016-10-17 DIAGNOSIS — D485 Neoplasm of uncertain behavior of skin: Secondary | ICD-10-CM | POA: Diagnosis not present

## 2016-10-17 DIAGNOSIS — Z85828 Personal history of other malignant neoplasm of skin: Secondary | ICD-10-CM | POA: Diagnosis not present

## 2016-10-17 DIAGNOSIS — C44319 Basal cell carcinoma of skin of other parts of face: Secondary | ICD-10-CM | POA: Diagnosis not present

## 2016-10-30 DIAGNOSIS — C44321 Squamous cell carcinoma of skin of nose: Secondary | ICD-10-CM | POA: Diagnosis not present

## 2016-10-30 DIAGNOSIS — Z85828 Personal history of other malignant neoplasm of skin: Secondary | ICD-10-CM | POA: Diagnosis not present

## 2016-11-07 DIAGNOSIS — R3912 Poor urinary stream: Secondary | ICD-10-CM | POA: Diagnosis not present

## 2016-11-07 DIAGNOSIS — N401 Enlarged prostate with lower urinary tract symptoms: Secondary | ICD-10-CM | POA: Diagnosis not present

## 2016-11-07 DIAGNOSIS — R351 Nocturia: Secondary | ICD-10-CM | POA: Diagnosis not present

## 2016-12-02 DIAGNOSIS — R972 Elevated prostate specific antigen [PSA]: Secondary | ICD-10-CM | POA: Diagnosis not present

## 2016-12-02 DIAGNOSIS — R351 Nocturia: Secondary | ICD-10-CM | POA: Diagnosis not present

## 2016-12-02 DIAGNOSIS — N401 Enlarged prostate with lower urinary tract symptoms: Secondary | ICD-10-CM | POA: Diagnosis not present

## 2016-12-09 ENCOUNTER — Ambulatory Visit (INDEPENDENT_AMBULATORY_CARE_PROVIDER_SITE_OTHER): Payer: PPO | Admitting: Cardiovascular Disease

## 2016-12-09 ENCOUNTER — Encounter: Payer: Self-pay | Admitting: Cardiovascular Disease

## 2016-12-09 VITALS — BP 142/86 | HR 59 | Ht 67.0 in | Wt 169.3 lb

## 2016-12-09 DIAGNOSIS — I10 Essential (primary) hypertension: Secondary | ICD-10-CM | POA: Diagnosis not present

## 2016-12-09 DIAGNOSIS — E7800 Pure hypercholesterolemia, unspecified: Secondary | ICD-10-CM

## 2016-12-09 DIAGNOSIS — Z79899 Other long term (current) drug therapy: Secondary | ICD-10-CM

## 2016-12-09 DIAGNOSIS — G459 Transient cerebral ischemic attack, unspecified: Secondary | ICD-10-CM

## 2016-12-09 MED ORDER — LOSARTAN POTASSIUM 50 MG PO TABS: ORAL_TABLET | ORAL | 2 refills | Status: DC

## 2016-12-09 NOTE — Patient Instructions (Addendum)
Medication Instructions:  INCREASE YOUR LOSARTAN TO 75 MG DAILY   Labwork: BMET IN 2-4 WEEKS WHEN YOU RETURN FOR YOUR DOPPLER   Testing/Procedures: Your physician has requested that you have a carotid duplex. This test is an ultrasound of the carotid arteries in your neck. It looks at blood flow through these arteries that supply the brain with blood. Allow one hour for this exam. There are no restrictions or special instructions. 2 WEEKS   Follow-Up: Your physician wants you to follow-up in: [place] will receive a reminder letter in the mail two months in advance. If you don't receive a letter, please call our office to schedule the follow-up appointment.  If you need a refill on your cardiac medications before your next appointment, please call your pharmacy.

## 2016-12-09 NOTE — Progress Notes (Signed)
Cardiology Office Note   Date:  12/09/2016   ID:  Johnny Barnes, DOB 06-03-1951, MRN [id number]  PCP:  Patient, No Pcp Per  Cardiologist:   Skeet Latch, MD   Chief Complaint  Patient presents with  . Shortness of Breath    pt states some       History of Present Illness: Johnny Barnes is a 66 y.o. male with hypertension who presents for follow up.  He has known that his blood presure was high for 15-20 years.  Johnny Barnes noted chest discomfort and underwent nuclear stress testing 05/11/15 that was negative for ischemia. It did show a hypertensive response to exercise.  He was started on losartan and we recommended lifestyle interventions.  He previously drank alcohol heavily but started to abstain from alcohol. Cholesterol levels were checked after 3 months of lifestyle interventions. He started Atorvastatin and his cholesterol has been well controlled. He initially had mild myalgias but these have improved.  At his last appointment Johnny Barnes's blood pressure was above goal. However he wanted to work on diet and exercise other than increasing his medication at that time.  He notes that his blood pressure continues to be elevated. He had one episode of chest pain that occurred when laying in bed. He denies exertional symptoms. He also denies lower extremity edema, orthopnea, or PND. He did report an episode of vision changes in his R eye.  His vision looked like a kaleidoscope. This happened twice in the last year. It lasted for approximately 30 minutes.  There is no associated slurred speech, weakness, numbness, or tingling.     Past Medical History:  Diagnosis Date  . Chest pain, precordial 05/04/2015  . Essential hypertension 05/04/2015  . Hyperlipidemia 06/13/2015    No past surgical history on file.   Current Outpatient Prescriptions  Medication Sig Dispense Refill  . atorvastatin (LIPITOR) 20 MG tablet Take 1 tablet (20 mg total) by mouth daily. 90 tablet 1  .  finasteride (PROSCAR) 5 MG tablet Take 1 tablet by mouth daily.    . imiquimod (ALDARA) 5 % cream Apply 1 application topically as directed.    [name] losartan (COZAAR) 50 MG tablet 1 AND 1/2 TABLETS BY MOUTH DAILY 135 tablet 2  . tamsulosin (FLOMAX) 0.4 MG CAPS capsule Take 0.4 mg by mouth.     No current facility-administered medications for this visit.     Allergies:   Aspirin   Social History:  The patient  reports that he has been smoking Cigars.  He has never used smokeless tobacco. He reports that he drinks alcohol. He reports that he uses drugs.   Family History:  The patient's family history includes Hypertension in his father and mother.    ROS:  Please see the history of present illness.   Otherwise, review of systems are positive for none.   All other systems are reviewed and negative.    PHYSICAL EXAM: VS:  BP (!) 142/86   Pulse (!) 59   Ht 5\' 7"  (1.702 m)   Wt 76.8 kg (169 lb 4.8 oz)   BMI 26.52 kg/m  , BMI Body mass index is 26.52 kg/m. GENERAL:  Well appearing.  No acute distress. HEENT:  Pupils equal round and reactive, fundi not visualized, oral mucosa unremarkable NECK:  No jugular venous distention, waveform within normal limits, carotid upstroke brisk and symmetric, no bruits LUNGS:  Clear to auscultation bilaterally.  No crackles, wheezes or rhonchi HEART:  RRR.  PMI  not displaced or sustained,S1 and S2 within normal limits, no S3, no S4, no clicks, no rubs, no murmurs ABD:  Flat, positive bowel sounds normal in frequency in pitch, no bruits, no rebound, no guarding, no midline pulsatile mass, no hepatomegaly, no splenomegaly EXT:  2 plus pulses throughout, no edema, no cyanosis no clubbing SKIN:  No rashes no nodules NEURO:  Cranial nerves II through XII grossly intact, motor grossly intact throughout PSYCH:  Cognitively intact, oriented to person place and time  EKG:  EKG is ordered today. 06/12/16: Sinus rhythm.  Rate 65 bpm.  RBBB.  12/09/16: Sinus  bradycardia.  Rate 59 bpm.  RBBB.   Exercise Cardiolite 05/11/15:   Nuclear stress EF: 58%.  The left ventricular ejection fraction is normal (55-65%).  Blood pressure demonstrated a hypertensive response to exercise.  There was no ST segment deviation noted during stress.  No T wave inversion was noted during stress.  Small size and intensity, fixed apical septal and apical inferior wall defect, likely artifact from RBBB. No reversible ischemia. LVEF 58%. This is a low risk study.  Recent Labs: 07/26/2016: ALT 24; BUN 16; Creat 0.92; Potassium 4.2; Sodium 139    Lipid Panel    Component Value Date/Time   CHOL 138 07/26/2016 0846   TRIG 47 07/26/2016 0846   HDL 76 07/26/2016 0846   CHOLHDL 1.8 07/26/2016 0846   VLDL 9 07/26/2016 0846   LDLCALC 53 07/26/2016 0846      Wt Readings from Last 3 Encounters:  12/09/16 76.8 kg (169 lb 4.8 oz)  06/12/16 74.9 kg (165 lb 3.2 oz)  10/26/15 76.3 kg (168 lb 3.2 oz)      ASSESSMENT AND PLAN:  # Hypertension:  Blood pressure is above goal.  Increase losartan to 75 mg daily.  Check BMP in 2-4 weeks.   # Hyperlipidemia: LDL 53 07/2016.  Continue atorvastaitn.   # Vision changes: Concerning for TIA.  Recommended f/u with Optho.  Check carotid Dopplers.    Current medicines are reviewed at length with the patient today.  The patient does not have concerns regarding medicines.  The following changes have been made:  None  Labs/ tests ordered today include:   Orders Placed This Encounter  Procedures  . Basic metabolic panel     Disposition:   FU with Johnny Barnes C. Oval Linsey, MD in 6 months.   Signed, Skeet Latch, MD  12/09/2016 1:05 PM    [facility]

## 2016-12-23 ENCOUNTER — Ambulatory Visit (HOSPITAL_COMMUNITY)
Admission: RE | Admit: 2016-12-23 | Discharge: 2016-12-23 | Disposition: A | Discharge status: Home / Self Care | Payer: PPO | Source: Ambulatory Visit | Attending: Cardiology | Admitting: Cardiology

## 2016-12-23 DIAGNOSIS — E785 Hyperlipidemia, unspecified: Secondary | ICD-10-CM | POA: Insufficient documentation

## 2016-12-23 DIAGNOSIS — Z79899 Other long term (current) drug therapy: Secondary | ICD-10-CM | POA: Diagnosis not present

## 2016-12-23 DIAGNOSIS — I10 Essential (primary) hypertension: Secondary | ICD-10-CM | POA: Insufficient documentation

## 2016-12-23 DIAGNOSIS — Z720 Tobacco use: Secondary | ICD-10-CM | POA: Insufficient documentation

## 2016-12-23 DIAGNOSIS — G459 Transient cerebral ischemic attack, unspecified: Secondary | ICD-10-CM | POA: Insufficient documentation

## 2016-12-24 LAB — BASIC METABOLIC PANEL
BUN / CREAT RATIO: 18 (ref 10–24)
BUN: 16 mg/dL (ref 8–27)
CO2: 24 mmol/L (ref 20–29)
CREATININE: 0.88 mg/dL (ref 0.76–1.27)
Calcium: 9 mg/dL (ref 8.6–10.2)
Chloride: 103 mmol/L (ref 96–106)
GFR calc Af Amer: 104 mL/min/{1.73_m2} (ref >59)
GFR, EST NON AFRICAN AMERICAN: 90 mL/min/{1.73_m2} (ref >59)
GLUCOSE: 87 mg/dL (ref 65–99)
Potassium: 5 mmol/L (ref 3.5–5.2)
SODIUM: 141 mmol/L (ref 134–144)

## 2017-01-16 DIAGNOSIS — Z85828 Personal history of other malignant neoplasm of skin: Secondary | ICD-10-CM | POA: Diagnosis not present

## 2017-01-16 DIAGNOSIS — C44319 Basal cell carcinoma of skin of other parts of face: Secondary | ICD-10-CM | POA: Diagnosis not present

## 2017-01-16 DIAGNOSIS — L570 Actinic keratosis: Secondary | ICD-10-CM | POA: Diagnosis not present

## 2017-02-11 DIAGNOSIS — Z85828 Personal history of other malignant neoplasm of skin: Secondary | ICD-10-CM | POA: Diagnosis not present

## 2017-02-11 DIAGNOSIS — C44319 Basal cell carcinoma of skin of other parts of face: Secondary | ICD-10-CM | POA: Diagnosis not present

## 2017-05-26 DIAGNOSIS — R972 Elevated prostate specific antigen [PSA]: Secondary | ICD-10-CM | POA: Diagnosis not present

## 2017-06-09 ENCOUNTER — Ambulatory Visit: Payer: PPO | Admitting: Cardiovascular Disease

## 2017-06-09 ENCOUNTER — Encounter: Payer: Self-pay | Admitting: Cardiovascular Disease

## 2017-06-09 VITALS — BP 124/70 | HR 61 | Ht 67.0 in | Wt 165.0 lb

## 2017-06-09 DIAGNOSIS — I10 Essential (primary) hypertension: Secondary | ICD-10-CM

## 2017-06-09 DIAGNOSIS — E7800 Pure hypercholesterolemia, unspecified: Secondary | ICD-10-CM

## 2017-06-09 NOTE — Progress Notes (Signed)
Cardiology Office Note   Date:  06/09/2017   ID:  Johnny Barnes, DOB Aug 22, 1950, MRN [id number]  PCP:  Patient, No Pcp Per  Cardiologist:   Skeet Latch, MD   No chief complaint on file.     History of Present Illness: Johnny Barnes is a 66 y.o. male with hypertension who presents for follow up.  He has known that his blood presure was high for 15-20 years.  Mr. Ingle noted chest discomfort and underwent nuclear stress testing 05/11/15 that was negative for ischemia. It did show a hypertensive response to exercise.  He was started on losartan and we recommended lifestyle interventions.  He previously drank alcohol heavily but started to abstain from alcohol. Cholesterol levels were checked after 3 months of lifestyle interventions. He started Atorvastatin and his cholesterol has been well controlled. He initially had mild myalgias but these have improved.  At his last appointment Mr. Pires's blood pressure was above goal. Losartan was increased.  He also reported intermittent episodes of R eye vision changes.  He was referred to his ophthalmologist and had carotid Dopplers 11/2016 that showed no obstruction.  Since his last appointment Mr. Palomino has been feeling well.  He has rare palpitations that last for a second.  He denies lightheadedness or dizziness.  He hasn't been exercising much but is active.  He has no chest pain or shortness of breath with exertion.  He has noted that he has more indigestion lately.  He wonders if this is because he started back drinking coffee or because he increased his dose of losartan.  He does not check his blood pressure at home.  He has not noted any chest pain or shortness of breath.  He also denies lower extremity edema, orthopnea, or PND.  He continues to smoke 1 cigar daily and is not ready to quit.  He notes that he does not inhale.    Past Medical History:  Diagnosis Date  . Chest pain, precordial 05/04/2015  . Essential  hypertension 05/04/2015  . Hyperlipidemia 06/13/2015    History reviewed. No pertinent surgical history.   Current Outpatient Medications  Medication Sig Dispense Refill  . finasteride (PROSCAR) 5 MG tablet Take 1 tablet by mouth daily.    [name] losartan (COZAAR) 50 MG tablet 1 AND 1/2 TABLETS BY MOUTH DAILY 135 tablet 2  . tamsulosin (FLOMAX) 0.4 MG CAPS capsule Take 0.4 mg by mouth.    [name] atorvastatin (LIPITOR) 20 MG tablet Take 1 tablet (20 mg total) by mouth daily. 90 tablet 1   No current facility-administered medications for this visit.     Allergies:   Aspirin   Social History:  The patient  reports that he has been smoking cigars.  he has never used smokeless tobacco. He reports that he drinks alcohol. He reports that he uses drugs.   Family History:  The patient's family history includes Hypertension in his father and mother.    ROS:  Please see the history of present illness.   Otherwise, review of systems are positive for none.   All other systems are reviewed and negative.    PHYSICAL EXAM: VS:  BP 124/70   Pulse 61   Ht 5\' 7"  (1.702 m)   Wt 165 lb (74.8 kg)   BMI 25.84 kg/m  , BMI Body mass index is 25.84 kg/m. GENERAL:  Well appearing HEENT: Pupils equal round and reactive, fundi not visualized, oral mucosa unremarkable NECK:  No jugular venous distention, waveform within  normal limits, carotid upstroke brisk and symmetric, no bruits, no thyromegaly LUNGS:  Clear to auscultation bilaterally HEART:  RRR.  PMI not displaced or sustained,S1 and S2 within normal limits, no S3, no S4, no clicks, no rubs, no murmurs ABD:  Flat, positive bowel sounds normal in frequency in pitch, no bruits, no rebound, no guarding, no midline pulsatile mass, no hepatomegaly, no splenomegaly EXT:  2 plus pulses throughout, no edema, no cyanosis no clubbing SKIN:  No rashes no nodules NEURO:  Cranial nerves II through XII grossly intact, motor grossly intact throughout PSYCH:  Cognitively  intact, oriented to person place and time   EKG:  EKG is ordered today. 06/12/16: Sinus rhythm.  Rate 65 bpm.  RBBB.  12/09/16: Sinus bradycardia.  Rate 59 bpm.  RBBB.  06/09/17: Sinus rhythm.  Rate 61 bpm.  RBBB.   Exercise Cardiolite 05/11/15:   Nuclear stress EF: 58%.  The left ventricular ejection fraction is normal (55-65%).  Blood pressure demonstrated a hypertensive response to exercise.  There was no ST segment deviation noted during stress.  No T wave inversion was noted during stress.  Small size and intensity, fixed apical septal and apical inferior wall defect, likely artifact from RBBB. No reversible ischemia. LVEF 58%. This is a low risk study.  Recent Labs: 07/26/2016: ALT 24 12/23/2016: BUN 16; Creatinine, Ser 0.88; Potassium 5.0; Sodium 141    Lipid Panel    Component Value Date/Time   CHOL 138 07/26/2016 0846   TRIG 47 07/26/2016 0846   HDL 76 07/26/2016 0846   CHOLHDL 1.8 07/26/2016 0846   VLDL 9 07/26/2016 0846   LDLCALC 53 07/26/2016 0846      Wt Readings from Last 3 Encounters:  06/09/17 165 lb (74.8 kg)  12/09/16 169 lb 4.8 oz (76.8 kg)  06/12/16 165 lb 3.2 oz (74.9 kg)      ASSESSMENT AND PLAN:  # Hypertension:  Blood pressure was initially elevated but better on repeat.  Continue losartan 75mg  daily.   # Hyperlipidemia: LDL 53 07/2016.  Continue atorvastatin.  Repeat lipids and CMP 07/2017.  # Vision changes: Only occurs after watching TV.  Carotid Dopplers were normal.   # Tobacco abuse: Patient doesn't want to stop smoking his cigar.    Current medicines are reviewed at length with the patient today.  The patient does not have concerns regarding medicines.  The following changes have been made:  None  Labs/ tests ordered today include:   Orders Placed This Encounter  Procedures  . Lipid panel  . Comprehensive metabolic panel  . EKG 12-Lead     Disposition:   FU with Jeptha Hinnenkamp C. Oval Linsey, MD in 1 year.    Signed, Skeet Latch, MD  06/09/2017 11:57 AM    [place]

## 2017-06-09 NOTE — Patient Instructions (Signed)
Medication Instructions:  Your physician recommends that you continue on your current medications as directed. Please refer to the Current Medication list given to you today.  Labwork: FASTING LP/CMET SOON   Testing/Procedures: NONE  Follow-Up: Your physician wants you to follow-up in: 1 [place] will receive a reminder letter in the mail two months in advance. If you don't receive a letter, please call our office to schedule the follow-up appointment.  If you need a refill on your cardiac medications before your next appointment, please call your pharmacy.

## 2017-07-01 DIAGNOSIS — N138 Other obstructive and reflux uropathy: Secondary | ICD-10-CM | POA: Diagnosis not present

## 2017-07-01 DIAGNOSIS — N401 Enlarged prostate with lower urinary tract symptoms: Secondary | ICD-10-CM | POA: Diagnosis not present

## 2017-08-06 DIAGNOSIS — E7800 Pure hypercholesterolemia, unspecified: Secondary | ICD-10-CM | POA: Diagnosis not present

## 2017-08-06 DIAGNOSIS — I10 Essential (primary) hypertension: Secondary | ICD-10-CM | POA: Diagnosis not present

## 2017-08-07 LAB — COMPREHENSIVE METABOLIC PANEL
ALBUMIN: 4.5 g/dL (ref 3.6–4.8)
ALT: 45 IU/L — AB (ref 0–44)
AST: 27 IU/L (ref 0–40)
Albumin/Globulin Ratio: 2.1 (ref 1.2–2.2)
Alkaline Phosphatase: 71 IU/L (ref 39–117)
BILIRUBIN TOTAL: 0.7 mg/dL (ref 0.0–1.2)
BUN/Creatinine Ratio: 15 (ref 10–24)
BUN: 15 mg/dL (ref 8–27)
CHLORIDE: 103 mmol/L (ref 96–106)
CO2: 22 mmol/L (ref 20–29)
CREATININE: 0.99 mg/dL (ref 0.76–1.27)
Calcium: 9.1 mg/dL (ref 8.6–10.2)
GFR calc non Af Amer: 79 mL/min/{1.73_m2} (ref >59)
GFR, EST AFRICAN AMERICAN: 91 mL/min/{1.73_m2} (ref >59)
Globulin, Total: 2.1 g/dL (ref 1.5–4.5)
Glucose: 83 mg/dL (ref 65–99)
Potassium: 4 mmol/L (ref 3.5–5.2)
Sodium: 140 mmol/L (ref 134–144)
TOTAL PROTEIN: 6.6 g/dL (ref 6.0–8.5)

## 2017-08-07 LAB — LIPID PANEL
CHOL/HDL RATIO: 1.9 ratio (ref 0.0–5.0)
Cholesterol, Total: 142 mg/dL (ref 100–199)
HDL: 76 mg/dL (ref >39)
LDL Calculated: 53 mg/dL (ref 0–99)
Triglycerides: 64 mg/dL (ref 0–149)
VLDL Cholesterol Cal: 13 mg/dL (ref 5–40)

## 2017-08-21 ENCOUNTER — Telehealth: Payer: Self-pay | Admitting: *Deleted

## 2017-08-21 DIAGNOSIS — R945 Abnormal results of liver function studies: Principal | ICD-10-CM

## 2017-08-21 DIAGNOSIS — R7989 Other specified abnormal findings of blood chemistry: Secondary | ICD-10-CM

## 2017-08-21 NOTE — Telephone Encounter (Signed)
Advised patient of lab results  Patient does not drink alcohol.  Mailed lab order for recheck in 3 months

## 2017-08-21 NOTE — Telephone Encounter (Signed)
-----   Message from Skeet Latch, MD sent at 08/18/2017  9:32 AM EST ----- Cholesterol levels are very good.  Normal kidney function and electrolytes.  Liver function very mildly elevated.  Repeat LFTs in 3 months.  Limit alcohol and acetaminophen.

## 2017-10-22 DIAGNOSIS — R945 Abnormal results of liver function studies: Secondary | ICD-10-CM | POA: Diagnosis not present

## 2017-10-22 LAB — HEPATIC FUNCTION PANEL
ALT: 37 IU/L (ref 0–44)
AST: 24 IU/L (ref 0–40)
Albumin: 4.3 g/dL (ref 3.6–4.8)
Alkaline Phosphatase: 67 IU/L (ref 39–117)
BILIRUBIN TOTAL: 0.7 mg/dL (ref 0.0–1.2)
Bilirubin, Direct: 0.17 mg/dL (ref 0.00–0.40)
Total Protein: 6.4 g/dL (ref 6.0–8.5)

## 2017-11-14 ENCOUNTER — Other Ambulatory Visit: Payer: Self-pay | Admitting: Cardiovascular Disease

## 2017-11-14 NOTE — Telephone Encounter (Signed)
This is Dr. Newark's pt.  °

## 2017-11-14 NOTE — Telephone Encounter (Signed)
Rx request sent to pharmacy.  

## 2017-11-14 NOTE — Telephone Encounter (Signed)
New Message:         *STAT* If patient is at the pharmacy, call can be transferred to refill team.   1. Which medications need to be refilled? (please list name of each medication and dose if known) losartan (COZAAR) 50 MG tablet/1 AND 1/2 TABLETS BY MOUTH DAILY  2. Which pharmacy/location (including street and city if local pharmacy) is medication to be sent to?[facility] [place].  3. Do they need a 30 day or 90 day supply? 135     Pt states he is unable to get anymore medication until the end of June due to the change in the way he is supposed to take the medication

## 2017-11-18 ENCOUNTER — Telehealth: Payer: Self-pay | Admitting: Cardiovascular Disease

## 2017-11-18 MED ORDER — LOSARTAN POTASSIUM 50 MG PO TABS: ORAL_TABLET | ORAL | 2 refills | Status: DC

## 2017-11-18 NOTE — Telephone Encounter (Signed)
Spoke to patient . Losartan  is not correct quantity. Patient  States direction state take 1 and 1/2tablest daily.PATIENT STATES HE ONLY HAS 90  TABLETS enough for 1 tablet daily.  RN reviewed chart, prescription was  esent to pharmacy - rquested to place on file. RN informed patient will call pharmacy and notify to refill prescription for ( take 1 and 1/2 tablets daily)  And discontinue  ( take 1 tablet daily)  Patient verbalized understading.  RN CALLED [facility] SPOKE  [facility] TECH. MEDICATION WILL FILLED.

## 2017-11-18 NOTE — Telephone Encounter (Signed)
Rx request sent to pharmacy.  

## 2017-11-18 NOTE — Telephone Encounter (Signed)
New Message:       Pt c/o medication issue:  1. Name of Medication: losartan (COZAAR) 50 MG tablet  2. How are you currently taking this medication (dosage and times per day)? TAKE 1 TABLET BY MOUTH DAILY  3. Are you having a reaction (difficulty breathing--STAT)? TAKE 1 TABLET BY MOUTH DAILY  4. What is your medication issue?Pt states he is needing this prescription in a different form so the insurance will pay for it. Pt states his insurance will not pay for anymore of this medication until the end of June.

## 2017-11-27 DIAGNOSIS — L570 Actinic keratosis: Secondary | ICD-10-CM | POA: Diagnosis not present

## 2017-11-27 DIAGNOSIS — D485 Neoplasm of uncertain behavior of skin: Secondary | ICD-10-CM | POA: Diagnosis not present

## 2017-11-27 DIAGNOSIS — D171 Benign lipomatous neoplasm of skin and subcutaneous tissue of trunk: Secondary | ICD-10-CM | POA: Diagnosis not present

## 2017-11-27 DIAGNOSIS — C44712 Basal cell carcinoma of skin of right lower limb, including hip: Secondary | ICD-10-CM | POA: Diagnosis not present

## 2017-11-27 DIAGNOSIS — L821 Other seborrheic keratosis: Secondary | ICD-10-CM | POA: Diagnosis not present

## 2017-11-27 DIAGNOSIS — Z85828 Personal history of other malignant neoplasm of skin: Secondary | ICD-10-CM | POA: Diagnosis not present

## 2017-12-04 DIAGNOSIS — C44712 Basal cell carcinoma of skin of right lower limb, including hip: Secondary | ICD-10-CM | POA: Diagnosis not present

## 2018-03-05 DIAGNOSIS — N401 Enlarged prostate with lower urinary tract symptoms: Secondary | ICD-10-CM | POA: Diagnosis not present

## 2018-03-12 DIAGNOSIS — R3912 Poor urinary stream: Secondary | ICD-10-CM | POA: Diagnosis not present

## 2018-03-12 DIAGNOSIS — N401 Enlarged prostate with lower urinary tract symptoms: Secondary | ICD-10-CM | POA: Diagnosis not present

## 2018-05-18 ENCOUNTER — Other Ambulatory Visit: Payer: Self-pay | Admitting: Cardiovascular Disease

## 2018-05-19 ENCOUNTER — Telehealth: Payer: Self-pay | Admitting: Cardiovascular Disease

## 2018-05-19 MED ORDER — VALSARTAN 80 MG PO TABS: 80.0000 mg | ORAL_TABLET | Freq: Every day | ORAL | 1 refills | Status: DC

## 2018-05-19 NOTE — Telephone Encounter (Signed)
Left message to call back  

## 2018-05-19 NOTE — Telephone Encounter (Signed)
New Message   Pt c/o medication issue:  1. Name of Medication: losartan (COZAAR) 50 MG tablet  2. How are you currently taking this medication (dosage and times per day)? 1 AND 1/2 TABLETS BY MOUTH DAILY  3. Are you having a reaction (difficulty breathing--STAT)? no  4. What is your medication issue? Pt states he is trying to get this medication refill but this particular brand is on back order. Pt is requesting that the pharmacy be contacted

## 2018-05-19 NOTE — Telephone Encounter (Signed)
Spoke with Johnny Barnes [phone] and they are unable to get the Losartan for the pt ... All of their manufacturers are on back order.. She is requesting if Dr. Oval Linsey can prescribe another BP med.the patient next OV is not until 06/2018

## 2018-05-19 NOTE — Telephone Encounter (Signed)
Rx changed to valsartan 80mg  daily.

## 2018-05-19 NOTE — Telephone Encounter (Signed)
Advised patient verbalized understanding  

## 2018-06-09 ENCOUNTER — Other Ambulatory Visit: Payer: Self-pay

## 2018-06-09 MED ORDER — VALSARTAN 80 MG PO TABS: 80.0000 mg | ORAL_TABLET | Freq: Every day | ORAL | 1 refills | Status: DC

## 2018-06-18 ENCOUNTER — Ambulatory Visit: Payer: PPO | Admitting: Cardiovascular Disease

## 2018-07-09 ENCOUNTER — Ambulatory Visit (INDEPENDENT_AMBULATORY_CARE_PROVIDER_SITE_OTHER): Payer: PPO | Admitting: Cardiovascular Disease

## 2018-07-09 ENCOUNTER — Ambulatory Visit: Payer: PPO | Admitting: Cardiovascular Disease

## 2018-07-09 ENCOUNTER — Encounter: Payer: Self-pay | Admitting: Cardiovascular Disease

## 2018-07-09 VITALS — BP 140/78 | HR 67 | Ht 67.0 in | Wt 181.6 lb

## 2018-07-09 DIAGNOSIS — I10 Essential (primary) hypertension: Secondary | ICD-10-CM

## 2018-07-09 DIAGNOSIS — Z5181 Encounter for therapeutic drug level monitoring: Secondary | ICD-10-CM

## 2018-07-09 DIAGNOSIS — E7800 Pure hypercholesterolemia, unspecified: Secondary | ICD-10-CM

## 2018-07-09 NOTE — Patient Instructions (Signed)
Medication Instructions:  Your physician recommends that you continue on your current medications as directed. Please refer to the Current Medication list given to you today.  If you need a refill on your cardiac medications before your next appointment, please call your pharmacy.   Lab work: FASTING LP/CMET SOON   If you have labs (blood work) drawn today and your tests are completely normal, you will receive your results only by: [name] MyChart Message (if you have MyChart) OR . A paper copy in the mail If you have any lab test that is abnormal or we need to change your treatment, we will call you to review the results.  Testing/Procedures: NONE  Follow-Up: At [facility], you and your health needs are our priority.  As part of our continuing mission to provide you with exceptional heart care, we have created designated Provider Care Teams.  These Care Teams include your primary Cardiologist (physician) and Advanced Practice Providers (APPs -  Physician Assistants and Nurse Practitioners) who all work together to provide you with the care you need, when you need it. You will need a follow up appointment in 12 months.  Please call our office 2 months in advance to schedule this appointment.  You may see DR [facility] or one of the following Advanced Practice Providers on your designated Care Team:   Kerin Ransom, PA-C Roby Lofts, [place] . Sande Rives, PA-C  Any Other Special Instructions Will Be Listed Below (If Applicable). WORK HARDER ON EXERCISE

## 2018-07-09 NOTE — Progress Notes (Signed)
Cardiology Office Note   Date:  07/09/2018   ID:  Ramirez Fullbright, DOB [birth year], MRN [id number]  PCP:  Patient, No Pcp Per  Cardiologist:   Skeet Latch, MD   No chief complaint on file.    History of Present Illness: Johnny Barnes is a 68 y.o. male with hypertension who presents for follow up.  He has known that his blood presure was high for 15-20 years.  Mr. Petersheim noted chest discomfort and underwent nuclear stress testing 05/11/15 that was negative for ischemia. It did show a hypertensive response to exercise.  He was started on losartan and we recommended lifestyle interventions.  He previously drank alcohol heavily but started to abstain from alcohol. Cholesterol levels were checked after 3 months of lifestyle interventions. He started Atorvastatin and his cholesterol has been well controlled. He initially had mild myalgias but these have improved.  At his last appointment Mr. Spies's blood pressure was above goal. Losartan was increased.  He also reported intermittent episodes of R eye vision changes.  He was referred to his ophthalmologist and had carotid Dopplers 11/2016 that showed no obstruction.  Since his last appointment Mr. Correll has been doing well.  He is not getting much exercise.  He goes ice-skating with his granddaughter once a month and does more exercise in the summertime.  He has been thinking about getting an exercise bike or treadmill but has not done so yet.  He has no exertional chest pain or shortness of breath.  He denies lower extremity edema, orthopnea, or PND.  He does not check his blood pressure regularly at home.  He mostly eats at home and rarely eats out.  He does limit the salt in his diet.  He had his blood pressure checked with his urologist last month and it was 119 systolic.  His only complaint today is that he has not been sleeping well.  He notes that he sometimes drinks up to 5 beers at a time.  He doesn't do this often. In the past  when he drank more heavily his BP was poorly controlled.    Past Medical History:  Diagnosis Date  . Chest pain, precordial 05/04/2015  . Essential hypertension 05/04/2015  . Hyperlipidemia 06/13/2015    History reviewed. No pertinent surgical history.   Current Outpatient Medications  Medication Sig Dispense Refill  . atorvastatin (LIPITOR) 20 MG tablet TAKE 1 TABLET BY MOUTH DAILY 90 tablet 1  . finasteride (PROSCAR) 5 MG tablet Take 1 tablet by mouth daily.    . tamsulosin (FLOMAX) 0.4 MG CAPS capsule Take 0.4 mg by mouth.    . valsartan (DIOVAN) 80 MG tablet Take 1 tablet (80 mg total) by mouth daily. 90 tablet 1   No current facility-administered medications for this visit.     Allergies:   Aspirin   Social History:  The patient  reports that he has been smoking cigars. He has never used smokeless tobacco. He reports current alcohol use. He reports current drug use.   Family History:  The patient's family history includes Hypertension in his father and mother.    ROS:  Please see the history of present illness.   Otherwise, review of systems are positive for none.   All other systems are reviewed and negative.    PHYSICAL EXAM: VS:  BP 140/78   Pulse 67   Ht 5\' 7"  (1.702 m)   Wt 181 lb 9.6 oz (82.4 kg)   BMI 28.44 kg/m  ,  BMI Body mass index is 28.44 kg/m. GENERAL:  Well appearing HEENT: Pupils equal round and reactive, fundi not visualized, oral mucosa unremarkable NECK:  No jugular venous distention, waveform within normal limits, carotid upstroke brisk and symmetric, no bruits LUNGS:  Clear to auscultation bilaterally HEART:  RRR.  PMI not displaced or sustained,S1 and S2 within normal limits, no S3, no S4, no clicks, no rubs, no murmurs ABD:  Flat, positive bowel sounds normal in frequency in pitch, no bruits, no rebound, no guarding, no midline pulsatile mass, no hepatomegaly, no splenomegaly EXT:  2 plus pulses throughout, no edema, no cyanosis no  clubbing SKIN:  No rashes no nodules NEURO:  Cranial nerves II through XII grossly intact, motor grossly intact throughout PSYCH:  Cognitively intact, oriented to person place and time    EKG:  EKG is ordered today. 06/12/16: Sinus rhythm.  Rate 65 bpm.  RBBB.  12/09/16: Sinus bradycardia.  Rate 59 bpm.  RBBB.  06/09/17: Sinus rhythm.  Rate 61 bpm.  RBBB.  07/09/18: Sinus rhythm.  Rate 67 bpm.  RBBB.   Exercise Cardiolite 05/11/15:   Nuclear stress EF: 58%.  The left ventricular ejection fraction is normal (55-65%).  Blood pressure demonstrated a hypertensive response to exercise.  There was no ST segment deviation noted during stress.  No T wave inversion was noted during stress.  Small size and intensity, fixed apical septal and apical inferior wall defect, likely artifact from RBBB. No reversible ischemia. LVEF 58%. This is a low risk study.  Recent Labs: 08/06/2017: BUN 15; Creatinine, Ser 0.99; Potassium 4.0; Sodium 140 10/22/2017: ALT 37    Lipid Panel    Component Value Date/Time   CHOL 142 08/06/2017 0803   TRIG 64 08/06/2017 0803   HDL 76 08/06/2017 0803   CHOLHDL 1.9 08/06/2017 0803   CHOLHDL 1.8 07/26/2016 0846   VLDL 9 07/26/2016 0846   LDLCALC 53 08/06/2017 0803      Wt Readings from Last 3 Encounters:  07/09/18 181 lb 9.6 oz (82.4 kg)  06/09/17 165 lb (74.8 kg)  12/09/16 169 lb 4.8 oz (76.8 kg)      ASSESSMENT AND PLAN:  # Hypertension:  Blood pressure was initially elevated but better on repeat. ASCVD 10 year risk 7.4%  Therefore his goal is <140/90.  We talked about increasing his exercise so that we don't have to add medication in the future.  Limit EtOH to no more than 2/days.  # Hyperlipidemia: Check lipids/CMP.  Continue atorvastatin.   # Tobacco abuse: Patient doesn't want to stop smoking his cigar.  He smokes rarely.   Current medicines are reviewed at length with the patient today.  The patient does not have concerns regarding  medicines.  The following changes have been made:  None  Labs/ tests ordered today include:   Orders Placed This Encounter  Procedures  . Lipid panel  . Comprehensive metabolic panel  . EKG 12-Lead     Disposition:   FU with Samanthajo Payano C. Oval Linsey, MD in 1 year.    Signed, Skeet Latch, MD  07/09/2018 11:29 AM    [facility]

## 2018-07-10 DIAGNOSIS — I10 Essential (primary) hypertension: Secondary | ICD-10-CM | POA: Diagnosis not present

## 2018-07-10 DIAGNOSIS — E7800 Pure hypercholesterolemia, unspecified: Secondary | ICD-10-CM | POA: Diagnosis not present

## 2018-07-10 DIAGNOSIS — Z5181 Encounter for therapeutic drug level monitoring: Secondary | ICD-10-CM | POA: Diagnosis not present

## 2018-07-11 LAB — LIPID PANEL
CHOL/HDL RATIO: 1.8 ratio (ref 0.0–5.0)
Cholesterol, Total: 148 mg/dL (ref 100–199)
HDL: 84 mg/dL (ref >39)
LDL Calculated: 51 mg/dL (ref 0–99)
Triglycerides: 65 mg/dL (ref 0–149)
VLDL Cholesterol Cal: 13 mg/dL (ref 5–40)

## 2018-07-11 LAB — COMPREHENSIVE METABOLIC PANEL
A/G RATIO: 2 (ref 1.2–2.2)
ALK PHOS: 64 IU/L (ref 39–117)
ALT: 21 IU/L (ref 0–44)
AST: 17 IU/L (ref 0–40)
Albumin: 4.5 g/dL (ref 3.6–4.8)
BUN/Creatinine Ratio: 14 (ref 10–24)
BUN: 15 mg/dL (ref 8–27)
Bilirubin Total: 0.9 mg/dL (ref 0.0–1.2)
CALCIUM: 9 mg/dL (ref 8.6–10.2)
CO2: 19 mmol/L — ABNORMAL LOW (ref 20–29)
Chloride: 105 mmol/L (ref 96–106)
Creatinine, Ser: 1.07 mg/dL (ref 0.76–1.27)
GFR calc Af Amer: 83 mL/min/{1.73_m2} (ref >59)
GFR calc non Af Amer: 72 mL/min/{1.73_m2} (ref >59)
GLOBULIN, TOTAL: 2.2 g/dL (ref 1.5–4.5)
Glucose: 86 mg/dL (ref 65–99)
Potassium: 4.1 mmol/L (ref 3.5–5.2)
SODIUM: 140 mmol/L (ref 134–144)
Total Protein: 6.7 g/dL (ref 6.0–8.5)

## 2018-10-28 ENCOUNTER — Telehealth: Payer: Self-pay | Admitting: Cardiovascular Disease

## 2018-10-28 DIAGNOSIS — C44311 Basal cell carcinoma of skin of nose: Secondary | ICD-10-CM | POA: Diagnosis not present

## 2018-10-28 DIAGNOSIS — Z85828 Personal history of other malignant neoplasm of skin: Secondary | ICD-10-CM | POA: Diagnosis not present

## 2018-10-28 DIAGNOSIS — D485 Neoplasm of uncertain behavior of skin: Secondary | ICD-10-CM | POA: Diagnosis not present

## 2018-10-28 DIAGNOSIS — L570 Actinic keratosis: Secondary | ICD-10-CM | POA: Diagnosis not present

## 2018-10-28 MED ORDER — LOSARTAN POTASSIUM 50 MG PO TABS: 75.0000 mg | ORAL_TABLET | Freq: Every day | ORAL | 1 refills | Status: DC

## 2018-10-28 NOTE — Telephone Encounter (Signed)
Spoke with the pt and made him aware of Georgina Peer, Pharm-D recommendation. Pt is agreeable to d/c valsartan and switch back to losartan 75mg  daily. Pt rqst 90 day supply. Rx sent to the pt pharmacy for #135 R-1. Pt adv to contact the office if any issues. Pt verbalized understanding and voiced appreciation for the assistance.

## 2018-10-28 NOTE — Telephone Encounter (Signed)
Losartan was changed due to pharmacy backorder, but several pharmacies in town are stocked with losartan again. Should be ok to change back to losartan 75mg  daily and discontinue valsartan. If unable to fill losartan at [facility], could try a  [facility] or [facility].

## 2018-10-28 NOTE — Telephone Encounter (Signed)
Spoke with the pt. Pt sts that he as a sensitive stomach and since he has started Valsartan he has developed intermittent diarrhea that he feels is related to the medication. Pt sts that he always takes his medication with food (breakfast or lunch).  He has not taken valsartan this morning. Pt sts that he was previously on Losartan and tolerated it fine. Adv the pt that I will fwd the message to Dr. Oval Linsey and the Pharm-D for their recommendation. Pt verbalized understanding.

## 2018-10-28 NOTE — Telephone Encounter (Signed)
Pt c/o medication issue:  1. Name of Medication:   valsartan (DIOVAN) 80 MG tablet  2. How are you currently taking this medication (dosage and times per day)? As directed, 80mg  daily  3. Are you having a reaction (difficulty breathing--STAT)? no  4. What is your medication issue?  Pt states he has been having stomach issues since starting this medicine. He wants to know if he can stop taking the medicine to see if the stomach issues resolve.

## 2018-11-10 ENCOUNTER — Other Ambulatory Visit: Payer: Self-pay | Admitting: Cardiovascular Disease

## 2018-11-12 DIAGNOSIS — C44311 Basal cell carcinoma of skin of nose: Secondary | ICD-10-CM | POA: Diagnosis not present

## 2018-11-12 DIAGNOSIS — Z85828 Personal history of other malignant neoplasm of skin: Secondary | ICD-10-CM | POA: Diagnosis not present

## 2019-03-09 DIAGNOSIS — N401 Enlarged prostate with lower urinary tract symptoms: Secondary | ICD-10-CM | POA: Diagnosis not present

## 2019-03-16 DIAGNOSIS — R3912 Poor urinary stream: Secondary | ICD-10-CM | POA: Diagnosis not present

## 2019-03-16 DIAGNOSIS — N401 Enlarged prostate with lower urinary tract symptoms: Secondary | ICD-10-CM | POA: Diagnosis not present

## 2019-03-16 DIAGNOSIS — R351 Nocturia: Secondary | ICD-10-CM | POA: Diagnosis not present

## 2019-04-26 ENCOUNTER — Other Ambulatory Visit: Payer: Self-pay | Admitting: Cardiovascular Disease

## 2019-05-10 ENCOUNTER — Other Ambulatory Visit: Payer: Self-pay | Admitting: Cardiovascular Disease

## 2019-08-09 ENCOUNTER — Telehealth: Payer: Self-pay | Admitting: Cardiovascular Disease

## 2019-08-09 NOTE — Telephone Encounter (Signed)
Spoke with pt, aware it is better to see the provider first and to try to fast for the appointment, Patient voiced understanding

## 2019-08-09 NOTE — Telephone Encounter (Signed)
Patient is calling requesting to have orders put in for lab work to be done on the same day as his office visit with Dr. Oval Linsey, 10/01/19 at 9:00 AM. Please call to discuss.

## 2019-09-14 ENCOUNTER — Other Ambulatory Visit: Payer: Self-pay | Admitting: Cardiovascular Disease

## 2019-09-14 MED ORDER — LOSARTAN POTASSIUM 50 MG PO TABS: 75.0000 mg | ORAL_TABLET | Freq: Every day | ORAL | 3 refills | Status: DC

## 2019-09-14 NOTE — Telephone Encounter (Signed)
 *  STAT* If patient is at the pharmacy, call can be transferred to refill team.   1. Which medications need to be refilled? (please list name of each medication and dose if known)losartan (COZAAR) 50 MG tablet  2. Which pharmacy/location (including street and city if local pharmacy) is medication to be sent to? [facility] [place] [address].  3. Do they need a 30 day or 90 day supply? [place]

## 2019-10-01 ENCOUNTER — Ambulatory Visit: Payer: PPO | Admitting: Cardiovascular Disease

## 2019-10-07 ENCOUNTER — Other Ambulatory Visit: Payer: Self-pay

## 2019-10-07 ENCOUNTER — Encounter: Payer: Self-pay | Admitting: Cardiovascular Disease

## 2019-10-07 ENCOUNTER — Ambulatory Visit: Payer: PPO | Admitting: Cardiovascular Disease

## 2019-10-07 DIAGNOSIS — M79605 Pain in left leg: Secondary | ICD-10-CM

## 2019-10-07 DIAGNOSIS — M79604 Pain in right leg: Secondary | ICD-10-CM

## 2019-10-07 DIAGNOSIS — I10 Essential (primary) hypertension: Secondary | ICD-10-CM

## 2019-10-07 DIAGNOSIS — E7800 Pure hypercholesterolemia, unspecified: Secondary | ICD-10-CM

## 2019-10-07 DIAGNOSIS — M79606 Pain in leg, unspecified: Secondary | ICD-10-CM

## 2019-10-07 HISTORY — DX: Pain in leg, unspecified: M79.606

## 2019-10-07 NOTE — Progress Notes (Signed)
Cardiology Office Note   Date:  10/07/2019   ID:  Johnny Barnes, DOB [birth year], MRN [id number]  PCP:  Patient, No Pcp Per  Cardiologist:   Skeet Latch, MD   No chief complaint on file.   History of Present Illness: Johnny Barnes is a 69 y.o. male with hypertension who presents for follow up.  He has known that his blood presure was high for 15-20 years.  Mr. Leathem noted chest discomfort and underwent nuclear stress testing 05/11/15 that was negative for ischemia. It did show a hypertensive response to exercise.  He was started on losartan and we recommended lifestyle interventions.  He previously drank alcohol heavily but started to abstain from alcohol. Cholesterol levels were checked after 3 months of lifestyle interventions. He started Atorvastatin and his cholesterol has been well controlled. He initially had mild myalgias but these have improved.  At his last appointment Mr. Noa's blood pressure was above goal. Losartan was increased.  He also reported intermittent episodes of R eye vision changes.  He was referred to his ophthalmologist and had carotid Dopplers 11/2016 that showed no obstruction.  Since his last appointment Mr. Rivard has been doing well.  He has had struggling with some leg aching.  This has been ongoing for a year.  He also has some sharp pain in his legs that wakes him up at night.  He does not check his blood pressure at home.  He also has not been exercising very much.  He likes to ice skate but is getting her kids been close due to the pandemic.  He has no exertional chest pain and his breathing has been stable.  He denies lower extremity edema, orthopnea, or PND.  He is mostly cooking at home.   Past Medical History:  Diagnosis Date  . Chest pain, precordial 05/04/2015  . Essential hypertension 05/04/2015  . Hyperlipidemia 06/13/2015  . Leg pain 10/07/2019    History reviewed. No pertinent surgical history.   Current Outpatient  Medications  Medication Sig Dispense Refill  . atorvastatin (LIPITOR) 20 MG tablet TAKE 1 TABLET BY MOUTH DAILY 90 tablet 1  . finasteride (PROSCAR) 5 MG tablet Take 1 tablet by mouth daily.    [name] losartan (COZAAR) 50 MG tablet Take 1.5 tablets (75 mg total) by mouth daily. 135 tablet 3  . tamsulosin (FLOMAX) 0.4 MG CAPS capsule Take 0.4 mg by mouth.     No current facility-administered medications for this visit.    Allergies:   Aspirin   Social History:  The patient  reports that he has been smoking cigars. He has never used smokeless tobacco. He reports current alcohol use. He reports current drug use.   Family History:  The patient's family history includes Hypertension in his father and mother.    ROS:  Please see the history of present illness.   Otherwise, review of systems are positive for none.   All other systems are reviewed and negative.    PHYSICAL EXAM: VS:  BP (!) 144/90   Pulse 67   Temp 98.6 F (37 C)   Ht 5\' 7"  (1.702 m)   Wt 180 lb (81.6 kg)   SpO2 94%   BMI 28.19 kg/m  , BMI Body mass index is 28.19 kg/m. GENERAL:  Well appearing HEENT: Pupils equal round and reactive, fundi not visualized, oral mucosa unremarkable NECK:  No jugular venous distention, waveform within normal limits, carotid upstroke brisk and symmetric, no bruits LUNGS:  Clear to auscultation  bilaterally HEART:  RRR.  PMI not displaced or sustained,S1 and S2 within normal limits, no S3, no S4, no clicks, no rubs, no murmurs ABD:  Flat, positive bowel sounds normal in frequency in pitch, no bruits, no rebound, no guarding, no midline pulsatile mass, no hepatomegaly, no splenomegaly EXT:  2 plus pulses throughout, no edema, no cyanosis no clubbing SKIN:  No rashes no nodules NEURO:  Cranial nerves II through XII grossly intact, motor grossly intact throughout PSYCH:  Cognitively intact, oriented to person place and time   EKG:  EKG is ordered today. 06/12/16: Sinus rhythm.  Rate 65 bpm.   RBBB.  12/09/16: Sinus bradycardia.  Rate 59 bpm.  RBBB.  06/09/17: Sinus rhythm.  Rate 61 bpm.  RBBB.  07/09/18: Sinus rhythm.  Rate 67 bpm.  RBBB.  10/07/19: Sinus rhythm.  Rate 67 bpm.  RBBB.  LPFB.  Exercise Cardiolite 05/11/15:   Nuclear stress EF: 58%.  The left ventricular ejection fraction is normal (55-65%).  Blood pressure demonstrated a hypertensive response to exercise.  There was no ST segment deviation noted during stress.  No T wave inversion was noted during stress.  Small size and intensity, fixed apical septal and apical inferior wall defect, likely artifact from RBBB. No reversible ischemia. LVEF 58%. This is a low risk study.  Recent Labs: No results found for requested labs within last 8760 hours.    Lipid Panel    Component Value Date/Time   CHOL 148 07/10/2018 0807   TRIG 65 07/10/2018 0807   HDL 84 07/10/2018 0807   CHOLHDL 1.8 07/10/2018 0807   CHOLHDL 1.8 07/26/2016 0846   VLDL 9 07/26/2016 0846   LDLCALC 51 07/10/2018 0807      Wt Readings from Last 3 Encounters:  10/07/19 180 lb (81.6 kg)  07/09/18 181 lb 9.6 oz (82.4 kg)  06/09/17 165 lb (74.8 kg)      ASSESSMENT AND PLAN:  # Hypertension: Blood pressure is borderline.  ASCVD 10 year risk 7.4%  Therefore his goal is <140/90.  We discussed the importance of increasing his exercise and limiting salt intake.  Continue losartan.  If his blood pressure remains elevated at follow-up we will need to titrate his antihypertensives.  # Hyperlipidemia: I suspect that his leg cramping is due to the statin.  He will try stopping it for a month.  If symptoms improve we will try a different statin.  If they persist he can resume it.  He will need fasting lipids and a CMP once he is back on his regimen.  # Tobacco abuse: Patient has no interest in quitting his cigars he smokes rarely.   Current medicines are reviewed at length with the patient today.  The patient does not have concerns regarding  medicines.  The following changes have been made:  None  Labs/ tests ordered today include:   No orders of the defined types were placed in this encounter.    Disposition:   FU with Trenda Corliss C. Oval Linsey, MD in 6 months   Signed, Skeet Latch, MD  10/07/2019 6:01 PM    [facility]

## 2019-10-07 NOTE — Patient Instructions (Signed)
Medication Instructions:  STOP ATORVASTATIN FOR 1 MONTH, CALL [phone] TO GIVE UPDATE ON ACHING  *If you need a refill on your cardiac medications before your next appointment, please call your pharmacy*  Lab Work: NONE   Testing/Procedures: NONE  Follow-Up: At [facility], you and your health needs are our priority.  As part of our continuing mission to provide you with exceptional heart care, we have created designated Provider Care Teams.  These Care Teams include your primary Cardiologist (physician) and Advanced Practice Providers (APPs -  Physician Assistants and Nurse Practitioners) who all work together to provide you with the care you need, when you need it.  We recommend signing up for the patient portal called "MyChart".  Sign up information is provided on this After Visit Summary.  MyChart is used to connect with patients for Virtual Visits (Telemedicine).  Patients are able to view lab/test results, encounter notes, upcoming appointments, etc.  Non-urgent messages can be sent to your provider as well.   To learn more about what you can do with MyChart, go to [email or web address].    Your next appointment:   6 month(s)  The format for your next appointment:   In Person  Provider:   You may see DR [facility] or one of the following Advanced Practice Providers on your designated Care Team:    Kerin Ransom, PA-C  [place], [place]  Coletta Memos, [place]  Other Instructions TRY TO EXERCISE [place]

## 2019-10-13 ENCOUNTER — Other Ambulatory Visit: Payer: Self-pay | Admitting: Cardiovascular Disease

## 2019-10-14 NOTE — Telephone Encounter (Signed)
Losartan previously refilled 09/14/2019

## 2019-11-08 ENCOUNTER — Telehealth: Payer: Self-pay | Admitting: Cardiovascular Disease

## 2019-11-08 DIAGNOSIS — Z5181 Encounter for therapeutic drug level monitoring: Secondary | ICD-10-CM

## 2019-11-08 DIAGNOSIS — E7800 Pure hypercholesterolemia, unspecified: Secondary | ICD-10-CM

## 2019-11-08 DIAGNOSIS — I10 Essential (primary) hypertension: Secondary | ICD-10-CM

## 2019-11-08 NOTE — Telephone Encounter (Signed)
The patient stated that he was advised to take a statin holiday for a month. He was calling to give an update.   He stated that the pain was better, not completely gone, but much better. He would like to know what the next step would be.

## 2019-11-08 NOTE — Telephone Encounter (Signed)
Let's try rosuvastatin 10mg  MWF.  Repeat lipids/CMP in 3 months.  Call if muscle aches recur.

## 2019-11-08 NOTE — Telephone Encounter (Signed)
New Message   Pt is calling and says he is returning a call to the nurse     Please call

## 2019-11-10 MED ORDER — ROSUVASTATIN CALCIUM 10 MG PO TABS: ORAL_TABLET | ORAL | 1 refills | Status: DC

## 2019-11-10 NOTE — Telephone Encounter (Signed)
Received mychart message patient is having still having pain Was having the sharp pain 80 % of the time at night but has improved, just aches now Pain moves around to different places  He will try the Rosuvastatin and call back if any issues, patient agreeable to plan

## 2019-11-10 NOTE — Telephone Encounter (Signed)
Advised patient and sent mychart message, patient verbalized understanding

## 2019-12-10 DIAGNOSIS — I10 Essential (primary) hypertension: Secondary | ICD-10-CM | POA: Diagnosis not present

## 2019-12-10 DIAGNOSIS — Z5181 Encounter for therapeutic drug level monitoring: Secondary | ICD-10-CM | POA: Diagnosis not present

## 2019-12-10 DIAGNOSIS — E7800 Pure hypercholesterolemia, unspecified: Secondary | ICD-10-CM | POA: Diagnosis not present

## 2019-12-10 LAB — COMPREHENSIVE METABOLIC PANEL
ALT: 14 IU/L (ref 0–44)
AST: 22 IU/L (ref 0–40)
Albumin/Globulin Ratio: 2 (ref 1.2–2.2)
Albumin: 4.5 g/dL (ref 3.8–4.8)
Alkaline Phosphatase: 62 IU/L (ref 48–121)
BUN/Creatinine Ratio: 14 (ref 10–24)
BUN: 15 mg/dL (ref 8–27)
Bilirubin Total: 0.7 mg/dL (ref 0.0–1.2)
CO2: 23 mmol/L (ref 20–29)
Calcium: 9.2 mg/dL (ref 8.6–10.2)
Chloride: 103 mmol/L (ref 96–106)
Creatinine, Ser: 1.04 mg/dL (ref 0.76–1.27)
GFR calc Af Amer: 85 mL/min/{1.73_m2} (ref >59)
GFR calc non Af Amer: 73 mL/min/{1.73_m2} (ref >59)
Globulin, Total: 2.3 g/dL (ref 1.5–4.5)
Glucose: 81 mg/dL (ref 65–99)
Potassium: 4.7 mmol/L (ref 3.5–5.2)
Sodium: 139 mmol/L (ref 134–144)
Total Protein: 6.8 g/dL (ref 6.0–8.5)

## 2019-12-10 LAB — LIPID PANEL
Chol/HDL Ratio: 1.9 ratio (ref 0.0–5.0)
Cholesterol, Total: 173 mg/dL (ref 100–199)
HDL: 89 mg/dL (ref >39)
LDL Chol Calc (NIH): 73 mg/dL (ref 0–99)
Triglycerides: 58 mg/dL (ref 0–149)
VLDL Cholesterol Cal: 11 mg/dL (ref 5–40)

## 2020-02-18 DIAGNOSIS — E7800 Pure hypercholesterolemia, unspecified: Secondary | ICD-10-CM

## 2020-03-08 DIAGNOSIS — K589 Irritable bowel syndrome without diarrhea: Secondary | ICD-10-CM | POA: Diagnosis not present

## 2020-03-13 DIAGNOSIS — K589 Irritable bowel syndrome without diarrhea: Secondary | ICD-10-CM | POA: Diagnosis not present

## 2020-03-21 DIAGNOSIS — N400 Enlarged prostate without lower urinary tract symptoms: Secondary | ICD-10-CM | POA: Diagnosis not present

## 2020-03-23 ENCOUNTER — Ambulatory Visit: Payer: PPO

## 2020-03-28 DIAGNOSIS — R351 Nocturia: Secondary | ICD-10-CM | POA: Diagnosis not present

## 2020-03-28 DIAGNOSIS — N401 Enlarged prostate with lower urinary tract symptoms: Secondary | ICD-10-CM | POA: Diagnosis not present

## 2020-03-28 DIAGNOSIS — R972 Elevated prostate specific antigen [PSA]: Secondary | ICD-10-CM | POA: Diagnosis not present

## 2020-04-05 DIAGNOSIS — K589 Irritable bowel syndrome without diarrhea: Secondary | ICD-10-CM | POA: Diagnosis not present

## 2020-04-11 ENCOUNTER — Telehealth: Payer: Self-pay

## 2020-04-11 ENCOUNTER — Ambulatory Visit (INDEPENDENT_AMBULATORY_CARE_PROVIDER_SITE_OTHER): Payer: PPO | Admitting: Pharmacist

## 2020-04-11 ENCOUNTER — Other Ambulatory Visit: Payer: Self-pay

## 2020-04-11 VITALS — BP 124/80 | HR 74 | Resp 16 | Ht 66.0 in | Wt 177.0 lb

## 2020-04-11 DIAGNOSIS — E7800 Pure hypercholesterolemia, unspecified: Secondary | ICD-10-CM

## 2020-04-11 DIAGNOSIS — I10 Essential (primary) hypertension: Secondary | ICD-10-CM | POA: Diagnosis not present

## 2020-04-11 NOTE — Patient Instructions (Addendum)
Return for a follow up appointment AS NEEDED  Check your blood pressure at home daily (if able) and keep record of the readings.  Take your meds as follows: *take losartan 1 tablet in the morning and 1/2 in the evening* *WILL try Livalo 2mg  every other day - after consult with GI doctor*  Bring all of your meds, your BP cuff and your record of home blood pressures to your next appointment.  Exercise as you're able, try to walk approximately 30 minutes per day.  Keep salt intake to a minimum, especially watch canned and prepared boxed foods.  Eat more fresh fruits and vegetables and fewer canned items.  Avoid eating in fast food restaurants.    HOW TO TAKE YOUR BLOOD PRESSURE: . Rest 5 minutes before taking your blood pressure. .  Don't smoke or drink caffeinated beverages for at least 30 minutes before. . Take your blood pressure before (not after) you eat. . Sit comfortably with your back supported and both feet on the floor (don't cross your legs). . Elevate your arm to heart level on a table or a desk. . Use the proper sized cuff. It should fit smoothly and snugly around your bare upper arm. There should be enough room to slip a fingertip under the cuff. The bottom edge of the cuff should be 1 inch above the crease of the elbow. . Ideally, take 3 measurements at one sitting and record the average.

## 2020-04-11 NOTE — Telephone Encounter (Signed)
Called and lmomed the pt stating to call us back asap to discuss taking rosuvastatin 10mg  mwf? ibs issues? Will await callback

## 2020-04-11 NOTE — Progress Notes (Signed)
Patient ID: Johnny Barnes                 DOB: [birth year]                    MRN: [id number]     HPI: Johnny Barnes is a 69 y.o. male patient referred to lipid clinic by Dr Oval Linsey. PMH is significant for hypertension, hyperlipidemia,nad chest pain. Noted LDL very well controlled with statin therapy, but patient is unable to tolerate for long term use. He developed muscle pain with atorvastatin, and has IBS failed up with rosuvastatin. Low dose rosuvastatin taken only 3x/week still GI issues.  Current Medications: none  Intolerances:  Atorvastatin  - muscle pain and lightheadedness Rosuvastatin - IBS flaire up  LDL goal: 100mg /dL  Diet: chicken breast, pork loin slices, lean meats  Exercise:activities of daily living, occasional skeating with grandkid  Family History: father had carcinoma of the lung, unknown reason for mother  age 53, brother died of COPD at age 43, sister passed of ovarian cancer  Social History: Former smoker (3 packs per day), smokes cigars about 3 per day, drinks 2-3 beers per up to 3 times per week, no drug use (passed hx of recreational marijuana).  Labs: 12/10/2019: CHO 173; TG 58, HDL 89; LDL-c 73  Past Medical History:  Diagnosis Date  . Chest pain, precordial 05/04/2015  . Essential hypertension 05/04/2015  . Hyperlipidemia 06/13/2015  . Leg pain 10/07/2019    Current Outpatient Medications on File Prior to Visit  Medication Sig Dispense Refill  . finasteride (PROSCAR) 5 MG tablet Take 1 tablet by mouth daily.    [name] losartan (COZAAR) 50 MG tablet Take 1.5 tablets (75 mg total) by mouth daily. 135 tablet 3  . psyllium (METAMUCIL) 58.6 % powder Take 1 packet by mouth 3 (three) times daily.    . tamsulosin (FLOMAX) 0.4 MG CAPS capsule Take 0.4 mg by mouth.     No current facility-administered medications on file prior to visit.    Allergies  Allergen Reactions  . Crestor [Rosuvastatin Calcium] Diarrhea  . Crestor [Rosuvastatin] Diarrhea  .  Lipitor [Atorvastatin]     Myalgia   . Aspirin Other (See Comments)    Burns his stomach    Essential hypertension Blood pressure well controlled in the afternoon, but patient reports elevated morning numbers. Will continue current losartan dose, but split into 1 tablets in the morning and 1/2 tablets every evening.He is to continue monitoring BP at home and follow up with Dr Oval Linsey as scheduled.   Hyperlipidemia LDD was very well controlled with rosuvastatin, but patient is unable to tolerate long term. Also noted intolerance to atorvastatin.  Will start pituvastatin 2mg  every other day (2 weeks sample provided), after f/u with GI doctor. Plan to repeat fasting lipid panel 3 months after initiating Livalo therapy.   Jais Demir Rodriguez-Guzman PharmD, BCPS, [place] [id number] 04/16/2020 7:36 PM

## 2020-04-13 ENCOUNTER — Ambulatory Visit: Payer: PPO | Admitting: Cardiovascular Disease

## 2020-04-13 ENCOUNTER — Encounter: Payer: Self-pay | Admitting: Cardiovascular Disease

## 2020-04-13 ENCOUNTER — Other Ambulatory Visit: Payer: Self-pay

## 2020-04-13 VITALS — BP 130/80 | HR 81 | Ht 67.0 in | Wt 175.0 lb

## 2020-04-13 DIAGNOSIS — E7800 Pure hypercholesterolemia, unspecified: Secondary | ICD-10-CM

## 2020-04-13 DIAGNOSIS — I10 Essential (primary) hypertension: Secondary | ICD-10-CM | POA: Diagnosis not present

## 2020-04-13 NOTE — Progress Notes (Signed)
Cardiology Office Note   Date:  04/13/2020   ID:  Johnny Barnes, DOB [birth year], MRN [id number]  PCP:  Patient, No Pcp Per  Cardiologist:   Skeet Latch, MD   No chief complaint on file.   History of Present Illness: Johnny Barnes is a 69 y.o. male with hypertension and hyperlipidemia who presents for follow up.  He has known that his blood presure was high for 15-20 years.  Johnny Barnes noted chest discomfort and underwent nuclear stress testing 05/11/15 that was negative for ischemia. It did show a hypertensive response to exercise.  He was started on losartan and we recommended lifestyle interventions.  He previously drank alcohol heavily but started to abstain from alcohol. Cholesterol levels were checked after 3 months of lifestyle interventions. He started Atorvastatin and his cholesterol has been well controlled. He initially had mild myalgias but these have improved.  At his last appointment Johnny Barnes was above goal. Losartan was increased.  He also reported intermittent episodes of R eye vision changes.  He was referred to his ophthalmologist and had carotid Dopplers 11/2016 that showed no obstruction.  At his last appointment rosuvastatin was held due to GI upset.  He previously did not tolerate atorvastatin due to myalgias.  He tried to restart the rosuvastatin but again had the GI upset.  He followed up with our pharmacist who recommended that he start Livalo.  However he has an appointment scheduled with the gastroenterologist 11/2 and wants to wait till after that appointment to start it.  He did note that his blood Barnes tends to run higher in the morning and then seems to be at goal in the evenings.  Our pharmacist recommended that he split his losartan to 50 mg in the morning and 20 5 in the evening.  This does seem to be helping.  Overall he has been feeling well.  He continues to ice skate with his grandchildren.  He does not otherwise get much  formal exercise but generally feels well with yard work and other activities.  He has no chest pain or shortness of breath.  He denies lower extremity edema, orthopnea, or PND.  He has not yet done his Covid vaccination and is weighing the risks and benefits.   Past Medical History:  Diagnosis Date  . Chest pain, precordial 05/04/2015  . Essential hypertension 05/04/2015  . Hyperlipidemia 06/13/2015  . Leg pain 10/07/2019    No past surgical history on file.   Current Outpatient Medications  Medication Sig Dispense Refill  . finasteride (PROSCAR) 5 MG tablet Take 1 tablet by mouth daily.    [name] losartan (COZAAR) 50 MG tablet Take 1.5 tablets (75 mg total) by mouth daily. 135 tablet 3  . psyllium (METAMUCIL) 58.6 % powder Take 1 packet by mouth 3 (three) times daily.    . tamsulosin (FLOMAX) 0.4 MG CAPS capsule Take 0.4 mg by mouth.     No current facility-administered medications for this visit.    Allergies:   Crestor [rosuvastatin calcium], Crestor [rosuvastatin], Lipitor [atorvastatin], and Aspirin   Social History:  The patient  reports that he has been smoking cigars. He has never used smokeless tobacco. He reports current alcohol use. He reports current drug use.   Family History:  The patient's family history includes Lung cancer in his father.    ROS:  Please see the history of present illness.   Otherwise, review of systems are positive for none.   All other systems  are reviewed and negative.    PHYSICAL EXAM: VS:  BP 130/80   Pulse 81   Ht 5\' 7"  (1.702 m)   Wt 175 lb (79.4 kg)   SpO2 98%   BMI 27.41 kg/m  , BMI Body mass index is 27.41 kg/m. GENERAL:  Well appearing HEENT: Pupils equal round and reactive, fundi not visualized, oral mucosa unremarkable NECK:  No jugular venous distention, waveform within normal limits, carotid upstroke brisk and symmetric, no bruits LUNGS:  Clear to auscultation bilaterally HEART:  RRR.  PMI not displaced or sustained,S1 and S2  within normal limits, no S3, no S4, no clicks, no rubs, no murmurs ABD:  Flat, positive bowel sounds normal in frequency in pitch, no bruits, no rebound, no guarding, no midline pulsatile mass, no hepatomegaly, no splenomegaly EXT:  2 plus pulses throughout, no edema, no cyanosis no clubbing SKIN:  No rashes no nodules NEURO:  Cranial nerves II through XII grossly intact, motor grossly intact throughout PSYCH:  Cognitively intact, oriented to person place and time    EKG:  EKG is ordered today. 06/12/16: Sinus rhythm.  Rate 65 bpm.  RBBB.  12/09/16: Sinus bradycardia.  Rate 59 bpm.  RBBB.  06/09/17: Sinus rhythm.  Rate 61 bpm.  RBBB.  07/09/18: Sinus rhythm.  Rate 67 bpm.  RBBB.  10/07/19: Sinus rhythm.  Rate 67 bpm.  RBBB.  LPFB. 04/13/2020: Sinus rhythm.  Rate 64 bpm.  Right bundle branch block.  Left posterior fascicular block.  Exercise Cardiolite 05/11/15:   Nuclear stress EF: 58%.  The left ventricular ejection fraction is normal (55-65%).  Blood Barnes demonstrated a hypertensive response to exercise.  There was no ST segment deviation noted during stress.  No T wave inversion was noted during stress.  Small size and intensity, fixed apical septal and apical inferior wall defect, likely artifact from RBBB. No reversible ischemia. LVEF 58%. This is a low risk study.  Recent Labs: 12/10/2019: ALT 14; BUN 15; Creatinine, Ser 1.04; Potassium 4.7; Sodium 139    Lipid Panel    Component Value Date/Time   CHOL 173 12/10/2019 0827   TRIG 58 12/10/2019 0827   HDL 89 12/10/2019 0827   CHOLHDL 1.9 12/10/2019 0827   CHOLHDL 1.8 07/26/2016 0846   VLDL 9 07/26/2016 0846   LDLCALC 73 12/10/2019 0827      Wt Readings from Last 3 Encounters:  04/13/20 175 lb (79.4 kg)  04/11/20 177 lb (80.3 kg)  10/07/19 180 lb (81.6 kg)      ASSESSMENT AND PLAN:  # Hypertension: Blood Barnes is better controlled.  Continue losartan.  # Hyperlipidemia: He did not tolerate  rosuvastatin or atorvastatin.  He is considering to take the statin.  He has follow-up with gastroenterology and will consider taking it after that appointment..  # Tobacco abuse: Patient has no interest in quitting his cigars he smokes rarely.  # [id number]-[id number]: Discussed and recommended vaccination.  He is thinking about it.   Current medicines are reviewed at length with the patient today.  The patient does not have concerns regarding medicines.  The following changes have been made:  None  Labs/ tests ordered today include:   Orders Placed This Encounter  Procedures  . EKG 12-Lead     Disposition:   FU with Johnny Riga C. Oval Linsey, MD in 1 year   Signed, Skeet Latch, MD  04/13/2020 2:39 PM    [place]

## 2020-04-13 NOTE — Patient Instructions (Addendum)
Medication Instructions:  Your physician recommends that you continue on your current medications as directed. Please refer to the Current Medication list given to you today.  *If you need a refill on your cardiac medications before your next appointment, please call your pharmacy*  Lab Work: NONE  Testing/Procedures: NONE  Follow-Up: At [facility], you and your health needs are our priority.  As part of our continuing mission to provide you with exceptional heart care, we have created designated Provider Care Teams.  These Care Teams include your primary Cardiologist (physician) and Advanced Practice Providers (APPs -  Physician Assistants and Nurse Practitioners) who all work together to provide you with the care you need, when you need it.  We recommend signing up for the patient portal called "MyChart".  Sign up information is provided on this After Visit Summary.  MyChart is used to connect with patients for Virtual Visits (Telemedicine).  Patients are able to view lab/test results, encounter notes, upcoming appointments, etc.  Non-urgent messages can be sent to your provider as well.   To learn more about what you can do with MyChart, go to [email or web address].    Your next appointment:   12 month(s)  You will receive a reminder letter in the mail two months in advance. If you don't receive a letter, please call our office to schedule the follow-up appointment.  The format for your next appointment:   In Person  Provider:   You may see DR Goshen or one of the following Advanced Practice Providers on your designated Care Team:    Luke Kilroy, PA-C  Callie Goodrich, PA-C  Jesse Cleaver, FNP     

## 2020-04-16 ENCOUNTER — Encounter: Payer: Self-pay | Admitting: Pharmacist

## 2020-04-16 NOTE — Assessment & Plan Note (Signed)
LDD was very well controlled with rosuvastatin, but patient is unable to tolerate long term. Also noted intolerance to atorvastatin.  Will start pituvastatin 2mg  every other day (2 weeks sample provided), after f/u with GI doctor. Plan to repeat fasting lipid panel 3 months after initiating Livalo therapy.

## 2020-04-16 NOTE — Assessment & Plan Note (Signed)
Blood pressure well controlled in the afternoon, but patient reports elevated morning numbers. Will continue current losartan dose, but split into 1 tablets in the morning and 1/2 tablets every evening.He is to continue monitoring BP at home and follow up with Dr Oval Linsey as scheduled.

## 2020-04-19 MED ORDER — PROBIOTIC DAILY PO CAPS: ORAL_CAPSULE | ORAL | Status: DC

## 2020-04-19 MED ORDER — PSYLLIUM 58.6 % PO POWD
ORAL | 0 refills | Status: DC
Start: 2020-04-19 — End: 2021-06-07

## 2020-04-25 ENCOUNTER — Other Ambulatory Visit: Payer: Self-pay | Admitting: Internal Medicine

## 2020-04-25 ENCOUNTER — Ambulatory Visit: Payer: PPO | Admitting: Internal Medicine

## 2020-04-25 ENCOUNTER — Encounter: Payer: Self-pay | Admitting: Internal Medicine

## 2020-04-25 VITALS — BP 118/80 | HR 78 | Ht 67.0 in | Wt 172.0 lb

## 2020-04-25 DIAGNOSIS — R1032 Left lower quadrant pain: Secondary | ICD-10-CM | POA: Diagnosis not present

## 2020-04-25 DIAGNOSIS — Z1211 Encounter for screening for malignant neoplasm of colon: Secondary | ICD-10-CM | POA: Diagnosis not present

## 2020-04-25 DIAGNOSIS — R194 Change in bowel habit: Secondary | ICD-10-CM

## 2020-04-25 DIAGNOSIS — Z1159 Encounter for screening for other viral diseases: Secondary | ICD-10-CM | POA: Diagnosis not present

## 2020-04-25 LAB — SARS CORONAVIRUS 2 (TAT 6-24 HRS): SARS Coronavirus 2: NEGATIVE

## 2020-04-25 MED ORDER — SUTAB 1479-225-188 MG PO TABS: 1.0000 | ORAL_TABLET | Freq: Once | ORAL | 0 refills | Status: AC

## 2020-04-25 NOTE — Progress Notes (Signed)
HISTORY OF PRESENT ILLNESS:  Johnny Barnes is a 69 y.o. 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  Patient reports a change in bowel habits dating back to 4 months ago.  He describes been diarrhea with mucus.  He thought that this was related to his medications, Crestor.  Medication was subsequently stopped and he was placed on a probiotic and Metamucil.  He states that this helped his bowels.  Over the past 6 weeks he has had intermittent problems with left lower quadrant pain.  He also reports 10 pound weight loss in the past 2 months.  Occasional spot of blood on the tissue.  No incontinence.  He cannot identify any exacerbating or relieving factors regarding his left lower quadrant pain.  No fevers.  He has not had prior colonoscopy.  We have outside blood work from December 10, 2019 shows normal comprehensive metabolic panel and normal lipid profile.  Has not been vaccinated against Covid  REVIEW OF SYSTEMS:  All non-GI ROS negative as otherwise stated in the HPI except for muscle cramps  Past Medical History:  Diagnosis Date  . Chest pain, precordial 05/04/2015  . Essential hypertension 05/04/2015  . Hyperlipidemia 06/13/2015  . Leg pain 10/07/2019    History reviewed. No pertinent surgical history.  Social History Johnny Barnes  reports that he has been smoking cigars. He has never used smokeless tobacco. He reports current alcohol use. He reports that he does not use drugs.  family history includes Lung cancer in his father; Ovarian cancer in his sister.  Allergies  Allergen Reactions  . Crestor [Rosuvastatin Calcium] Diarrhea  . Crestor [Rosuvastatin] Diarrhea  . Lipitor [Atorvastatin]     Myalgia   . Aspirin Other (See Comments)    Burns his stomach       PHYSICAL EXAMINATION: Vital signs: BP 118/80   Pulse 78   Ht 5\' 7"  (1.702 m)   Wt 172 lb (78 kg)   SpO2 98%   BMI 26.94 kg/m   Constitutional:  generally well-appearing, no acute distress Psychiatric: alert and oriented x3, cooperative Eyes: extraocular movements intact, anicteric, conjunctiva pink Mouth: oral pharynx moist, no lesions Neck: supple no lymphadenopathy Cardiovascular: heart regular rate and rhythm, no murmur Lungs: clear to auscultation bilaterally Abdomen: soft, nontender, nondistended, no obvious ascites, no peritoneal signs, normal bowel sounds, no organomegaly Rectal: Deferred until colonoscopy Extremities: no clubbing, cyanosis, or lower extremity edema bilaterally Skin: no lesions on visible extremities Neuro: No focal deficits.  Cranial nerves intact  ASSESSMENT:  1.  Prior problems with diarrhea and mucus per rectum.  Seemingly improved after discontinuation of Crestor and initiation of probiotic and fiber. 2.  6-week history of left lower quadrant pain.  Etiology unclear 3.  10 pound weight loss 4.  Colon cancer screening.  Has not had such to date.   PLAN:  1.  Schedule colonoscopy to evaluate change in bowel habits, weight loss, and left lower quadrant pain.  As well provide colon cancer screening.The nature of the procedure, as well as the risks, benefits, and alternatives were carefully and thoroughly reviewed with the patient. Ample time for discussion and questions allowed. The patient understood, was satisfied, and agreed to proceed. 2.  If no clear etiology for his pain found, then schedule CT scan of the abdomen pelvis.  He is aware and agrees.

## 2020-04-25 NOTE — Patient Instructions (Signed)
You have been scheduled for a colonoscopy. Please follow written instructions given to you at your visit today.  Please pick up your prep supplies at the pharmacy within the next 1-3 days. If you use inhalers (even only as needed), please bring them with you on the day of your procedure.   

## 2020-04-26 ENCOUNTER — Other Ambulatory Visit: Payer: Self-pay

## 2020-04-26 DIAGNOSIS — R634 Abnormal weight loss: Secondary | ICD-10-CM

## 2020-04-26 DIAGNOSIS — R1032 Left lower quadrant pain: Secondary | ICD-10-CM

## 2020-04-27 ENCOUNTER — Encounter: Payer: PPO | Admitting: Internal Medicine

## 2020-05-04 ENCOUNTER — Ambulatory Visit (HOSPITAL_COMMUNITY)
Admission: RE | Admit: 2020-05-04 | Discharge: 2020-05-04 | Disposition: A | Discharge status: Home / Self Care | Payer: PPO | Source: Ambulatory Visit | Attending: Internal Medicine | Admitting: Internal Medicine

## 2020-05-04 ENCOUNTER — Other Ambulatory Visit: Payer: Self-pay

## 2020-05-04 DIAGNOSIS — R634 Abnormal weight loss: Secondary | ICD-10-CM | POA: Insufficient documentation

## 2020-05-04 DIAGNOSIS — K7689 Other specified diseases of liver: Secondary | ICD-10-CM | POA: Diagnosis not present

## 2020-05-04 DIAGNOSIS — R1032 Left lower quadrant pain: Secondary | ICD-10-CM | POA: Diagnosis not present

## 2020-05-04 DIAGNOSIS — N400 Enlarged prostate without lower urinary tract symptoms: Secondary | ICD-10-CM | POA: Diagnosis not present

## 2020-05-04 DIAGNOSIS — N281 Cyst of kidney, acquired: Secondary | ICD-10-CM | POA: Diagnosis not present

## 2020-05-04 LAB — POCT I-STAT CREATININE: Creatinine, Ser: 1 mg/dL (ref 0.61–1.24)

## 2020-05-04 MED ORDER — IOHEXOL 300 MG/ML  SOLN
100.0000 mL | Freq: Once | INTRAMUSCULAR | Status: AC | PRN
  Administered 2020-05-04: 100 mL via INTRAVENOUS

## 2020-05-24 DIAGNOSIS — Z85828 Personal history of other malignant neoplasm of skin: Secondary | ICD-10-CM | POA: Diagnosis not present

## 2020-05-24 DIAGNOSIS — L570 Actinic keratosis: Secondary | ICD-10-CM | POA: Diagnosis not present

## 2020-05-26 ENCOUNTER — Other Ambulatory Visit: Payer: Self-pay | Admitting: Internal Medicine

## 2020-05-26 DIAGNOSIS — Z1159 Encounter for screening for other viral diseases: Secondary | ICD-10-CM | POA: Diagnosis not present

## 2020-05-26 LAB — SARS CORONAVIRUS 2 (TAT 6-24 HRS): SARS Coronavirus 2: NEGATIVE

## 2020-05-29 ENCOUNTER — Telehealth: Payer: Self-pay

## 2020-05-29 NOTE — Telephone Encounter (Signed)
Pt is scheduled to see Dr. Henrene Pastor on 05/30/20 at 4pm. Left vm to see if patient is interested in coming in for 1100 procedure.

## 2020-05-30 ENCOUNTER — Other Ambulatory Visit: Payer: Self-pay

## 2020-05-30 ENCOUNTER — Encounter: Payer: Self-pay | Admitting: Internal Medicine

## 2020-05-30 ENCOUNTER — Ambulatory Visit (AMBULATORY_SURGERY_CENTER): Payer: PPO | Admitting: Internal Medicine

## 2020-05-30 ENCOUNTER — Telehealth: Payer: Self-pay | Admitting: Internal Medicine

## 2020-05-30 VITALS — BP 138/80 | HR 62 | Temp 97.1°F | Resp 14 | Ht 67.0 in | Wt 172.0 lb

## 2020-05-30 DIAGNOSIS — R197 Diarrhea, unspecified: Secondary | ICD-10-CM | POA: Diagnosis not present

## 2020-05-30 DIAGNOSIS — R634 Abnormal weight loss: Secondary | ICD-10-CM | POA: Diagnosis not present

## 2020-05-30 DIAGNOSIS — K5730 Diverticulosis of large intestine without perforation or abscess without bleeding: Secondary | ICD-10-CM | POA: Diagnosis not present

## 2020-05-30 DIAGNOSIS — D122 Benign neoplasm of ascending colon: Secondary | ICD-10-CM

## 2020-05-30 DIAGNOSIS — R194 Change in bowel habit: Secondary | ICD-10-CM

## 2020-05-30 DIAGNOSIS — R1032 Left lower quadrant pain: Secondary | ICD-10-CM

## 2020-05-30 DIAGNOSIS — R198 Other specified symptoms and signs involving the digestive system and abdomen: Secondary | ICD-10-CM | POA: Diagnosis not present

## 2020-05-30 DIAGNOSIS — K6389 Other specified diseases of intestine: Secondary | ICD-10-CM | POA: Diagnosis not present

## 2020-05-30 DIAGNOSIS — Z1211 Encounter for screening for malignant neoplasm of colon: Secondary | ICD-10-CM

## 2020-05-30 MED ORDER — SODIUM CHLORIDE 0.9 % IV SOLN: 500.0000 mL | Freq: Once | INTRAVENOUS | Status: DC

## 2020-05-30 NOTE — Patient Instructions (Signed)
HANDOUTS PROVIDED ON: POLYPS & DIVERTICULOSIS  The polyp removed/biopsies taken today have been sent for pathology.  The results can take 1-3 weeks to receive.  When your next colonoscopy should occur will be based on the pathology results.    You may resume your previous diet and medication schedule.  Thank you for allowing [place] to care for you today!!!   YOU HAD AN ENDOSCOPIC PROCEDURE TODAY AT [place]:   Refer to the procedure report that was given to you for any specific questions about what was found during the examination.  If the procedure report does not answer your questions, please call your gastroenterologist to clarify.  If you requested that your care partner not be given the details of your procedure findings, then the procedure report has been included in a sealed envelope for you to review at your convenience later.  YOU SHOULD EXPECT: Some feelings of bloating in the abdomen. Passage of more gas than usual.  Walking can help get rid of the air that was put into your GI tract during the procedure and reduce the bloating. If you had a lower endoscopy (such as a colonoscopy or flexible sigmoidoscopy) you may notice spotting of blood in your stool or on the toilet paper. If you underwent a bowel prep for your procedure, you may not have a normal bowel movement for a few days.  Please Note:  You might notice some irritation and congestion in your nose or some drainage.  This is from the oxygen used during your procedure.  There is no need for concern and it should clear up in a day or so.  SYMPTOMS TO REPORT IMMEDIATELY:   Following lower endoscopy (colonoscopy or flexible sigmoidoscopy):  Excessive amounts of blood in the stool  Significant tenderness or worsening of abdominal pains  Swelling of the abdomen that is new, acute  Fever of 100F or higher  For urgent or emergent issues, a gastroenterologist can be reached at any hour by calling [phone]. Do  not use MyChart messaging for urgent concerns.    DIET:  We do recommend a small meal at first, but then you may proceed to your regular diet.  Drink plenty of fluids but you should avoid alcoholic beverages for 24 hours.  ACTIVITY:  You should plan to take it easy for the rest of today and you should NOT DRIVE or use heavy machinery until tomorrow (because of the sedation medicines used during the test).    FOLLOW UP: Our staff will call the number listed on your records Thursday morning between 7:15 am and 8:15 am to check on you and address any questions or concerns that you may have regarding the information given to you following your procedure. If we do not reach you, we will leave a message.  We will attempt to reach you two times.  During this call, we will ask if you have developed any symptoms of COVID 19. If you develop any symptoms (ie: fever, flu-like symptoms, shortness of breath, cough etc.) before then, please call [phone].  If you test positive for Covid 19 in the 2 weeks post procedure, please call and report this information to [place].    If any biopsies were taken you will be contacted by phone or by letter within the next 1-3 weeks.  Please call us at [phone] if you have not heard about the biopsies in 3 weeks.    SIGNATURES/CONFIDENTIALITY: You and/or your care partner have signed  paperwork which will be entered into your electronic medical record.  These signatures attest to the fact that that the information above on your After Visit Summary has been reviewed and is understood.  Full responsibility of the confidentiality of this discharge information lies with you and/or your care-partner.

## 2020-05-30 NOTE — Progress Notes (Signed)
Called to room to assist during endoscopic procedure.  Patient ID and intended procedure confirmed with present staff. Received instructions for my participation in the procedure from the performing physician.  

## 2020-05-30 NOTE — Telephone Encounter (Signed)
Pls call pt, he is having trouble completing the prep. His procedure is today at 4:00pm.

## 2020-05-30 NOTE — Progress Notes (Signed)
PT taken to PACU. Monitors in place. VSS. Report given to RN. 

## 2020-05-30 NOTE — Telephone Encounter (Signed)
Pt completed 1st half of prep last night and is having burning pain at his rectum.  He states that he has diarrhea regularly and doesn't think he needs to complete 2nd half of prep this morning.  I explained to him that the prep should be completed to ensure a good clean out so that he can have a good exam and that Dr. Henrene Pastor would be able to see his entire colon.  I encouraged him several times to complete the 2nd half of prep. I also encouraged him to use vasoline or diaper cream at his rectum.   Pt verbalized understanding.

## 2020-05-30 NOTE — Op Note (Signed)
[facility] Patient Name: Johnny Barnes Procedure Date: 05/30/2020 3:47 PM MRN: [id number] Endoscopist: Docia Chuck. Henrene Pastor , MD Age: 69 Referring MD:  Date of Birth: 07-Mar-1951 Gender: Male Account #: [id number] Procedure:                Colonoscopy with cold snare polypectomy x 1; with                            biopsies Indications:              Screening for colorectal malignant neoplasm,                            Incidental left-sided abdominal pain noted,                            Incidental diarrhea noted, reported weight loss.                            Unremarkable CT scan. Medicines:                Monitored Anesthesia Care Procedure:                Pre-Anesthesia Assessment:                           - Prior to the procedure, a History and Physical                            was performed, and patient medications and                            allergies were reviewed. The patient's tolerance of                            previous anesthesia was also reviewed. The risks                            and benefits of the procedure and the sedation                            options and risks were discussed with the patient.                            All questions were answered, and informed consent                            was obtained. Prior Anticoagulants: The patient has                            taken no previous anticoagulant or antiplatelet                            agents. ASA Grade Assessment: II - A patient with  mild systemic disease. After reviewing the risks                            and benefits, the patient was deemed in                            satisfactory condition to undergo the procedure.                           After obtaining informed consent, the colonoscope                            was passed under direct vision. Throughout the                            procedure, the patient's blood pressure, pulse, and                             oxygen saturations were monitored continuously. The                            Colonoscope was introduced through the anus and                            advanced to the the cecum, identified by                            appendiceal orifice and ileocecal valve. The                            ileocecal valve, appendiceal orifice, and rectum                            were photographed. The quality of the bowel                            preparation was adequate to identify polyps 6 mm                            and larger in size. The colonoscopy was performed                            without difficulty. The patient tolerated the                            procedure well. The bowel preparation used was                            SUPREP. NOTE: Patient elected only to complete the                            first half of the prep (rectal burning with  defecation) Scope In: 3:58:46 PM Scope Out: 4:11:14 PM Scope Withdrawal Time: 0 hours 10 minutes 55 seconds  Total Procedure Duration: 0 hours 12 minutes 28 seconds  Findings:                 A 3 mm polyp was found in the ascending colon. The                            polyp was removed with a cold snare. Resection and                            retrieval were complete.                           A few diverticula were found in the sigmoid colon.                           The entire examined colon appeared otherwise normal                            on direct and retroflexion views. Biopsies for                            histology were taken with a cold forceps from the                            entire colon for evaluation of microscopic colitis. Complications:            No immediate complications. Estimated blood loss:                            None. Estimated Blood Loss:     Estimated blood loss: none. Impression:               - One 3 mm polyp in the ascending colon, removed                             with a cold snare. Resected and retrieved.                           - Diverticulosis in the sigmoid colon.                           - The entire examined colon is otherwise normal on                            direct and retroflexion views. Recommendation:           - Repeat colonoscopy in 5 years for surveillance                            (need to complete entire prep).                           - Patient has a contact number available for  emergencies. The signs and symptoms of potential                            delayed complications were discussed with the                            patient. Return to normal activities tomorrow.                            Written discharge instructions were provided to the                            patient.                           - Resume previous diet.                           - Continue present medications.                           - Await pathology results. We will correspond                            regarding the results Docia Chuck. Henrene Pastor, MD 05/30/2020 4:20:57 PM This report has been signed electronically.

## 2020-06-01 ENCOUNTER — Telehealth: Payer: Self-pay

## 2020-06-01 ENCOUNTER — Telehealth: Payer: Self-pay | Admitting: *Deleted

## 2020-06-01 NOTE — Telephone Encounter (Signed)
No answer, left message to call back if having any issues or concerns, B.Marialuisa Basara RN. 

## 2020-06-01 NOTE — Telephone Encounter (Signed)
Left message on f/u call 

## 2020-06-05 ENCOUNTER — Encounter: Payer: Self-pay | Admitting: Internal Medicine

## 2020-06-12 ENCOUNTER — Telehealth: Payer: Self-pay

## 2020-06-12 NOTE — Telephone Encounter (Signed)
-----   Message from Harrington Challenger, [place] sent at 06/07/2020  4:04 PM EST ----- Regarding: FW: Lipids Please call patient. Verify of taking Livalo or Crestor.  Thanks ----- Message ----- From: Harrington Challenger, RPH-CPP Sent: 05/23/2020  12:00 AM EST To: Raquel Rodriguez-Guzman, RPH-CPP Subject: Lipids                                         Taking livalo? Fu with GI?

## 2020-06-12 NOTE — Telephone Encounter (Signed)
Called and spoke w/pt and he stated that he hasn't started any cholesterol med yet but that "it is on the agenda." will route to raquel pharmd

## 2020-06-29 DIAGNOSIS — R972 Elevated prostate specific antigen [PSA]: Secondary | ICD-10-CM | POA: Diagnosis not present

## 2020-07-18 DIAGNOSIS — J029 Acute pharyngitis, unspecified: Secondary | ICD-10-CM | POA: Diagnosis not present

## 2020-07-18 DIAGNOSIS — R509 Fever, unspecified: Secondary | ICD-10-CM | POA: Diagnosis not present

## 2020-07-18 DIAGNOSIS — Z20828 Contact with and (suspected) exposure to other viral communicable diseases: Secondary | ICD-10-CM | POA: Diagnosis not present

## 2020-08-07 ENCOUNTER — Telehealth: Payer: Self-pay

## 2020-08-07 DIAGNOSIS — E7800 Pure hypercholesterolemia, unspecified: Secondary | ICD-10-CM

## 2020-08-07 NOTE — Telephone Encounter (Signed)
-----   Message from Harrington Challenger, [place] sent at 08/07/2020  8:08 AM EST ----- Regarding: FW: Livalo Please call patient  Thanks ----- Message ----- From: Harrington Challenger, RPH-CPP Sent: 08/07/2020  12:00 AM EST To: Raquel Rodriguez-Guzman, RPH-CPP Subject: Livalo                                         Taking Livalo? Repeat fasting blood work after 8 weeks

## 2020-08-07 NOTE — Telephone Encounter (Signed)
Called and lmomed pt regarding are they taking livalo and if they are we need them to complete fasting lipids and hepatic and instructed them no appt needed for here but that we need a callback to let us know how they are doing w/it

## 2020-08-07 NOTE — Addendum Note (Signed)
Addended by: Allean Found on: 08/07/2020 08:26 AM   Modules accepted: Orders

## 2020-09-21 DIAGNOSIS — Z85828 Personal history of other malignant neoplasm of skin: Secondary | ICD-10-CM | POA: Diagnosis not present

## 2020-09-21 DIAGNOSIS — L814 Other melanin hyperpigmentation: Secondary | ICD-10-CM | POA: Diagnosis not present

## 2020-09-21 DIAGNOSIS — D485 Neoplasm of uncertain behavior of skin: Secondary | ICD-10-CM | POA: Diagnosis not present

## 2020-09-21 DIAGNOSIS — C44319 Basal cell carcinoma of skin of other parts of face: Secondary | ICD-10-CM | POA: Diagnosis not present

## 2020-09-23 DIAGNOSIS — R972 Elevated prostate specific antigen [PSA]: Secondary | ICD-10-CM | POA: Diagnosis not present

## 2020-09-25 DIAGNOSIS — R972 Elevated prostate specific antigen [PSA]: Secondary | ICD-10-CM | POA: Diagnosis not present

## 2020-10-02 DIAGNOSIS — R972 Elevated prostate specific antigen [PSA]: Secondary | ICD-10-CM | POA: Diagnosis not present

## 2020-10-02 DIAGNOSIS — N401 Enlarged prostate with lower urinary tract symptoms: Secondary | ICD-10-CM | POA: Diagnosis not present

## 2020-10-02 DIAGNOSIS — R351 Nocturia: Secondary | ICD-10-CM | POA: Diagnosis not present

## 2020-10-16 ENCOUNTER — Other Ambulatory Visit: Payer: Self-pay | Admitting: Cardiovascular Disease

## 2020-10-24 DIAGNOSIS — C44319 Basal cell carcinoma of skin of other parts of face: Secondary | ICD-10-CM | POA: Diagnosis not present

## 2020-10-24 DIAGNOSIS — Z85828 Personal history of other malignant neoplasm of skin: Secondary | ICD-10-CM | POA: Diagnosis not present

## 2021-04-06 NOTE — Progress Notes (Signed)
Cardiology Office Note   Date:  04/09/2021   ID:  Johnny Barnes, DOB 12-17-1950, MRN [id number]  PCP:  Patient, No Pcp Per (Inactive)  Cardiologist:   Skeet Latch, MD   No chief complaint on file.   History of Present Illness: Johnny Barnes is a 70 y.o. male with hypertension and hyperlipidemia who presents for follow up.  He has known that his blood presure was high for 15-20 years.  Mr. Seyfried noted chest discomfort and underwent nuclear stress testing 05/11/15 that was negative for ischemia. It did show a hypertensive response to exercise.  He was started on losartan and we recommended lifestyle interventions.  He previously drank alcohol heavily but started to abstain from alcohol. Cholesterol levels were checked after 3 months of lifestyle interventions. He started Atorvastatin and his cholesterol has been well controlled. He initially had mild myalgias but these have improved. Mr. Gruber's blood pressure was above goal. Losartan was increased.  He also reported intermittent episodes of R eye vision changes.  He was referred to his ophthalmologist and had carotid Dopplers 11/2016 that showed no obstruction.  Rosuvastatin was held due to GI upset.  He previously did not tolerate atorvastatin due to myalgias.  He tried to restart the rosuvastatin but again had the GI upset.  He followed up with our pharmacist who recommended that he start Livalo.  However he had an appointment scheduled with the gastroenterologist 11/2 and wanted to wait till after that appointment to start it.  He did note that his blood pressure tends to run higher in the morning and then seems to be at goal in the evenings.  Our pharmacist recommended that he split his losartan to 50 mg in the morning and 20 5 in the evening. At his last visit he was deciding whether or not to restart a statin. He wanted input from his GI doctor.  Today, he is feeling good overall and has no new complaints. He is feeling much  better since discontinuing his previous statin. Lately he has noticed his blood pressure is low in the afternoons and higher in the mornings. He has not been monitoring his blood pressure at home. In clinic today his blood pressure is 138/80. For activity, he continues to spend time with his grandchildren every few months. They enjoy ice skating for up to 1.5 hours at a time. For his diet he has been successfully avoiding foods high in fat, including ice cream. He also mostly cooks meals at home. However, he has been drinking up to 6 glasses of alcohol in a night. He denies any palpitations, chest pain, or shortness of breath. No lightheadedness, headaches, syncope, orthopnea, or PND. Also has no lower extremity edema or exertional symptoms.   Past Medical History:  Diagnosis Date   Chest pain, precordial 05/04/2015   Essential hypertension 05/04/2015   Hyperlipidemia 06/13/2015   Leg pain 10/07/2019    History reviewed. No pertinent surgical history.   Current Outpatient Medications  Medication Sig Dispense Refill   finasteride (PROSCAR) 5 MG tablet Take 1 tablet by mouth daily.     losartan (COZAAR) 50 MG tablet TAKE 1 AND 1/2 TABLETS BY MOUTH DAILY 135 tablet 3   psyllium (METAMUCIL) 58.6 % powder 1 rounded teaspoon daily 283 g 0   tamsulosin (FLOMAX) 0.4 MG CAPS capsule Take 0.4 mg by mouth.     No current facility-administered medications for this visit.    Allergies:   Crestor [rosuvastatin calcium], Crestor [rosuvastatin], Lipitor [atorvastatin],  Livalo [pitavastatin], and Aspirin   Social History:  The patient  reports that he has been smoking cigars. He has never used smokeless tobacco. He reports current alcohol use. He reports that he does not use drugs.   Family History:  The patient's family history includes Lung cancer in his father; Ovarian cancer in his sister.    ROS:   Please see the history of present illness. All other systems are reviewed and negative.     PHYSICAL EXAM: VS:  BP (!) 150/82 (BP Location: Right Arm, Patient Position: Sitting, Cuff Size: Large)   Pulse 81   Ht 5\' 7"  (1.702 m)   Wt 183 lb (83 kg)   BMI 28.66 kg/m  , BMI Body mass index is 28.66 kg/m. GENERAL:  Well appearing HEENT: Pupils equal round and reactive, fundi not visualized, oral mucosa unremarkable NECK:  No jugular venous distention, waveform within normal limits, carotid upstroke brisk and symmetric, no bruits LUNGS:  Clear to auscultation bilaterally HEART:  RRR.  PMI not displaced or sustained,S1 and S2 within normal limits, no S3, no S4, no clicks, no rubs, no murmurs ABD:  Flat, positive bowel sounds normal in frequency in pitch, no bruits, no rebound, no guarding, no midline pulsatile mass, no hepatomegaly, no splenomegaly EXT:  2 plus pulses throughout, no edema, no cyanosis no clubbing SKIN:  No rashes no nodules NEURO:  Cranial nerves II through XII grossly intact, motor grossly intact throughout PSYCH:  Cognitively intact, oriented to person place and time   EKG:   06/12/16: Sinus rhythm.  Rate 65 bpm.  RBBB.  12/09/16: Sinus bradycardia.  Rate 59 bpm.  RBBB.  06/09/17: Sinus rhythm.  Rate 61 bpm.  RBBB.  07/09/18: Sinus rhythm.  Rate 67 bpm.  RBBB.  10/07/19: Sinus rhythm.  Rate 67 bpm.  RBBB.  LPFB. 04/13/2020: Sinus rhythm.  Rate 64 bpm.  Right bundle branch block.  Left posterior fascicular block. 04/09/2021: Sinus rhythm. Rate 81 bpm. RBBB.  Carotid Duplex 12/23/2016: Technologist Notes: Duplex imaging of the carotid arteries reveals smooth vessel walls, without plaque formation or flow reduction. ICA velocities are normal, bilaterally. The vertebral arteries are patent with antegrade flow, bilaterally. The subclavian arteries are widely patent, with normal velocity flow.  Impressions: Normal carotid arteries, bilaterally. Patent vertebral arteries with antegrade flow. Normal subclavian arteries, bilaterally.  Exercise Cardiolite  05/11/15:  Nuclear stress EF: 58%. The left ventricular ejection fraction is normal (55-65%). Blood pressure demonstrated a hypertensive response to exercise. There was no ST segment deviation noted during stress. No T wave inversion was noted during stress.   Small size and intensity, fixed apical septal and apical inferior wall defect, likely artifact from RBBB. No reversible ischemia. LVEF 58%. This is a low risk study.  Recent Labs: 05/04/2020: Creatinine, Ser 1.00    Lipid Panel    Component Value Date/Time   CHOL 173 12/10/2019 0827   TRIG 58 12/10/2019 0827   HDL 89 12/10/2019 0827   CHOLHDL 1.9 12/10/2019 0827   CHOLHDL 1.8 07/26/2016 0846   VLDL 9 07/26/2016 0846   LDLCALC 73 12/10/2019 0827      Wt Readings from Last 3 Encounters:  04/09/21 183 lb (83 kg)  05/30/20 172 lb (78 kg)  04/25/20 172 lb (78 kg)      ASSESSMENT AND PLAN: Essential hypertension Blood pressure is poorly controlled today.  He has not been checking it at home regularly.  He is also gained about 10 pounds since his last  appointment which is not helping.  We discussed the importance of increasing his exercise to at least 150 minutes weekly.  We will increase losartan to 100 mg and asked him to check his blood pressures at home and bring that to follow-up.  We will also assist him with getting a blood pressure cuff.  Hyperlipidemia Not tolerated several statins.  Atherosclerosis of the aorta was noted on the CT in 2021.  He has no ischemic symptoms.  He will come back for fasting lipids and a CMP.  If his LDL is greater than 70 we will need to consider Repatha or Inclisiran.    Current medicines are reviewed at length with the patient today.  The patient does not have concerns regarding medicines.  The following changes have been made:  Increase losartan to 100mg .   Labs/ tests ordered today include:   No orders of the defined types were placed in this encounter.    Disposition:    FU  with APP in 2-3 months. FU with Philbert Ocallaghan C. Oval Linsey, MD in 1 year.  I,Mathew Stumpf,acting as a Education administrator for Skeet Latch, MD.,have documented all relevant documentation on the behalf of Skeet Latch, MD,as directed by  Skeet Latch, MD while in the presence of Skeet Latch, MD.  I, Godfrey Oval Linsey, MD have reviewed all documentation for this visit.  The documentation of the exam, diagnosis, procedures, and orders on 04/09/2021 are all accurate and complete.   Signed, Skeet Latch, MD  04/09/2021 1:51 PM    [place]

## 2021-04-09 ENCOUNTER — Encounter (HOSPITAL_BASED_OUTPATIENT_CLINIC_OR_DEPARTMENT_OTHER): Payer: Self-pay | Admitting: Cardiovascular Disease

## 2021-04-09 ENCOUNTER — Ambulatory Visit (HOSPITAL_BASED_OUTPATIENT_CLINIC_OR_DEPARTMENT_OTHER): Payer: PPO | Admitting: Cardiovascular Disease

## 2021-04-09 ENCOUNTER — Other Ambulatory Visit: Payer: Self-pay

## 2021-04-09 DIAGNOSIS — E7800 Pure hypercholesterolemia, unspecified: Secondary | ICD-10-CM

## 2021-04-09 DIAGNOSIS — I10 Essential (primary) hypertension: Secondary | ICD-10-CM

## 2021-04-09 MED ORDER — LOSARTAN POTASSIUM 100 MG PO TABS
100.0000 mg | ORAL_TABLET | Freq: Every day | ORAL | 3 refills | Status: DC
Start: 2021-04-09 — End: 2022-04-04

## 2021-04-09 MED ORDER — BLOOD PRESSURE MONITOR KIT: PACK | 0 refills | Status: DC

## 2021-04-09 NOTE — Patient Instructions (Signed)
Medication Instructions:  INCREASE YOUR LOSARTAN TO 100 MG DAILY   *If you need a refill on your cardiac medications before your next appointment, please call your pharmacy*  Lab Work: FASTING LP/CMET IN 1 WEEK   If you have labs (blood work) drawn today and your tests are completely normal, you will receive your results only by: [place] (if you have MyChart) OR A paper copy in the mail If you have any lab test that is abnormal or we need to change your treatment, we will call you to review the results.  Testing/Procedures: NONE   Follow-Up: At [facility], you and your health needs are our priority.  As part of our continuing mission to provide you with exceptional heart care, we have created designated Provider Care Teams.  These Care Teams include your primary Cardiologist (physician) and Advanced Practice Providers (APPs -  Physician Assistants and Nurse Practitioners) who all work together to provide you with the care you need, when you need it.  We recommend signing up for the patient portal called "MyChart".  Sign up information is provided on this After Visit Summary.  MyChart is used to connect with patients for Virtual Visits (Telemedicine).  Patients are able to view lab/test results, encounter notes, upcoming appointments, etc.  Non-urgent messages can be sent to your provider as well.   To learn more about what you can do with MyChart, go to [email or web address].    Your next appointment:   2 month(s)  The format for your next appointment:   In Person  Provider:   Laurann Montana, NP  Your physician recommends that you schedule a follow-up appointment in: [place]   Other Instructions MONITOR YOUR BLOOD PRESSURE AND BRING YOUR READINGS ALONG WITH MACHINE TO [facility] [place]

## 2021-04-09 NOTE — Assessment & Plan Note (Signed)
Not tolerated several statins.  Atherosclerosis of the aorta was noted on the CT in 2021.  He has no ischemic symptoms.  He will come back for fasting lipids and a CMP.  If his LDL is greater than 70 we will need to consider Repatha or Inclisiran.

## 2021-04-09 NOTE — Assessment & Plan Note (Signed)
Blood pressure is poorly controlled today.  He has not been checking it at home regularly.  He is also gained about 10 pounds since his last appointment which is not helping.  We discussed the importance of increasing his exercise to at least 150 minutes weekly.  We will increase losartan to 100 mg and asked him to check his blood pressures at home and bring that to follow-up.  We will also assist him with getting a blood pressure cuff.

## 2021-04-10 DIAGNOSIS — E7800 Pure hypercholesterolemia, unspecified: Secondary | ICD-10-CM | POA: Diagnosis not present

## 2021-04-10 DIAGNOSIS — I10 Essential (primary) hypertension: Secondary | ICD-10-CM | POA: Diagnosis not present

## 2021-04-11 LAB — COMPREHENSIVE METABOLIC PANEL
ALT: 13 IU/L (ref 0–44)
AST: 18 IU/L (ref 0–40)
Albumin/Globulin Ratio: 2 (ref 1.2–2.2)
Albumin: 4.5 g/dL (ref 3.8–4.8)
Alkaline Phosphatase: 55 IU/L (ref 44–121)
BUN/Creatinine Ratio: 12 (ref 10–24)
BUN: 12 mg/dL (ref 8–27)
Bilirubin Total: 0.7 mg/dL (ref 0.0–1.2)
CO2: 19 mmol/L — ABNORMAL LOW (ref 20–29)
Calcium: 8.8 mg/dL (ref 8.6–10.2)
Chloride: 106 mmol/L (ref 96–106)
Creatinine, Ser: 0.97 mg/dL (ref 0.76–1.27)
Globulin, Total: 2.3 g/dL (ref 1.5–4.5)
Glucose: 85 mg/dL (ref 70–99)
Potassium: 4.2 mmol/L (ref 3.5–5.2)
Sodium: 141 mmol/L (ref 134–144)
Total Protein: 6.8 g/dL (ref 6.0–8.5)
eGFR: 85 mL/min/{1.73_m2} (ref >59)

## 2021-04-11 LAB — LIPID PANEL
Chol/HDL Ratio: 2.6 ratio (ref 0.0–5.0)
Cholesterol, Total: 201 mg/dL — ABNORMAL HIGH (ref 100–199)
HDL: 77 mg/dL (ref >39)
LDL Chol Calc (NIH): 112 mg/dL — ABNORMAL HIGH (ref 0–99)
Triglycerides: 69 mg/dL (ref 0–149)
VLDL Cholesterol Cal: 12 mg/dL (ref 5–40)

## 2021-04-12 ENCOUNTER — Encounter (HOSPITAL_BASED_OUTPATIENT_CLINIC_OR_DEPARTMENT_OTHER): Payer: Self-pay

## 2021-06-06 NOTE — Progress Notes (Signed)
Office Visit    Patient Name: Johnny Barnes Date of Encounter: 06/07/2021  Primary Care Provider:  Patient, No Pcp Per (Inactive) Primary Cardiologist:  Skeet Latch, MD  Chief Complaint    70 year old male with a history of hypertension, hyperlipidemia, precordial chest pain, RBBB, and prior alcohol abuse follow-up related to hypertension and hyperlipidemia.  Past Medical History    Past Medical History:  Diagnosis Date   Chest pain, precordial 05/04/2015   Essential hypertension 05/04/2015   Hyperlipidemia 06/13/2015   Leg pain 10/07/2019   History reviewed. No pertinent surgical history.  Allergies  Allergies  Allergen Reactions   Crestor [Rosuvastatin Calcium] Diarrhea   Crestor [Rosuvastatin] Diarrhea   Lipitor [Atorvastatin]     Myalgia    Livalo [Pitavastatin]     myalgia   Aspirin Other (See Comments)    Burns his stomach    History of Present Illness    70 year old male with above past medical history including hypertension, hyperlipidemia, precordial chest pain, RBBB, and prior alcohol abuse.   Followed by Dr. Oval Linsey. He has had atypical chest pain in the past, Myoview in 2016 was normal, EF 55-65%.  He has a history of right eye vision changes for which his ophthalmologist referred him for carotid Dopplers in 2018, this was normal.  CT in 2021 showed atherosclerosis of the aorta. Of note, he is intolerant to statins. He was last seen in the office on 04/09/21 and was doing well overall from a cardiac standpoint.  He had gained weight and his blood pressure was elevated.  Losartan was increased to 100 mg daily.  LDL was also above goal. Dr. Oval Linsey recommended consideration for Repatha or Inclisiran at follow-up visit.   He presents today for follow-up.  Since his last visit he has been stable from a cardiac standpoint. He has not been checking his blood pressures at home as he states he bought a cuff that did not work and he returned to the store.   He is taking his blood pressure medication as prescribed, however, blood pressure remains elevated in office today. He reports drinking 5 beers daily, 3 to 5 days a week, he also smokes cigars. He is not taking any medication for cholesterol and declines referral to lipid clinic at this time. Otherwise, he has no concerns today.  Home Medications    Current Outpatient Medications  Medication Sig Dispense Refill   finasteride (PROSCAR) 5 MG tablet Take 1 tablet by mouth daily.     hydrochlorothiazide (HYDRODIURIL) 25 MG tablet Take 1 tablet (25 mg total) by mouth daily. 90 tablet 3   losartan (COZAAR) 100 MG tablet Take 1 tablet (100 mg total) by mouth daily. 90 tablet 3   tamsulosin (FLOMAX) 0.4 MG CAPS capsule Take 0.4 mg by mouth.     No current facility-administered medications for this visit.     Review of Systems    He denies chest pain, palpitations, dyspnea, pnd, orthopnea, n, v, dizziness, syncope, edema, weight gain, or early satiety. All other systems reviewed and are otherwise negative except as noted above.   Physical Exam    VS:  BP (!) 148/86 (BP Location: Left Arm, Patient Position: Sitting, Cuff Size: Large)    Pulse 70    Ht 5\' 7"  (1.702 m)    Wt 183 lb 4.8 oz (83.1 kg)    BMI 28.71 kg/m  Vitals:   06/07/21 1326 06/07/21 1357  BP: (!) 148/86   Pulse: (!) 104 70  mu     GEN: Well nourished, well developed, in no acute distress. HEENT: normal. Neck: Supple, no JVD, carotid bruits, or masses. Cardiac: RRR, no murmurs, rubs, or gallops. No clubbing, cyanosis, edema.  Radials/DP/PT 2+ and equal bilaterally.  Respiratory:  Respirations regular and unlabored, clear to auscultation bilaterally. GI: Soft, nontender, nondistended, BS + x 4. MS: no deformity or atrophy. Skin: warm and dry, no rash. Neuro:  Strength and sensation are intact. Psych: Normal affect.  Accessory Clinical Findings    ECG personally reviewed by me today - No EKG in office today.   Lab  Results  Component Value Date   WBC 6.2 05/04/2015   HGB 15.5 05/04/2015   HCT 44.6 05/04/2015   MCV 88.8 05/04/2015   PLT 228 05/04/2015   Lab Results  Component Value Date   CREATININE 0.97 04/10/2021   BUN 12 04/10/2021   NA 141 04/10/2021   K 4.2 04/10/2021   CL 106 04/10/2021   CO2 19 (L) 04/10/2021   Lab Results  Component Value Date   ALT 13 04/10/2021   AST 18 04/10/2021   ALKPHOS 55 04/10/2021   BILITOT 0.7 04/10/2021   Lab Results  Component Value Date   CHOL 201 (H) 04/10/2021   HDL 77 04/10/2021   LDLCALC 112 (H) 04/10/2021   TRIG 69 04/10/2021   CHOLHDL 2.6 04/10/2021    No results found for: HGBA1C  Assessment & Plan   1. Hypertension: BP elevated in office today, 148/86.  He is also somewhat tachycardic upon arrival.  Recheck heart rate 70 bpm.  His losartan was increased at last visit. He is not checking his BP at home and drinks alcohol regularly, which could be further elevating his blood pressure.  I encouraged him to reduce his alcohol intake and monitor his BP daily at home and report BPs consistently greater than 130/80.  He will purchase a new BP cuff. Will start HCTZ 25 mg daily, continue Losartan.  Check BMET in 2 weeks. If he tolerates Losartan and HCTZ could consider transition to combination pill in the future.   2. Hyperlipidemia: LDL was 112 on 04/10/21. He is statin intolerant.  Lipid clinic pharmacist recommended he start Livalo, which he did not tolerate.  We discussed referral to lipid clinic for consideration of Repatha vs. Inclisiran. He declines lipid clinic referral at this time.  Could consider Zetia at next visit (I do not see where he has taken this), however he does report significant GI side effects including diarrhea from medications in the past, so this may not be the best option for him. Asked him to please let us know he changes his mind and would like for [place] to refer him to the lipid clinic.  Will need to readdress at next visit.    3. Atherosclerosis of aorta on CT: Noted on prior CT (2021). No angina. Statin intolerant as above.   4. Tobacco use: He smokes cigars regularly. Full cessation advised.   5. Disposition: BMET in 2 weeks. Follow-up in 3-4 months.   Lenna Sciara, NP 06/07/2021, 5:09 PM

## 2021-06-07 ENCOUNTER — Encounter (HOSPITAL_BASED_OUTPATIENT_CLINIC_OR_DEPARTMENT_OTHER): Payer: Self-pay | Admitting: Nurse Practitioner

## 2021-06-07 ENCOUNTER — Other Ambulatory Visit: Payer: Self-pay

## 2021-06-07 ENCOUNTER — Ambulatory Visit (HOSPITAL_BASED_OUTPATIENT_CLINIC_OR_DEPARTMENT_OTHER): Payer: PPO | Admitting: Nurse Practitioner

## 2021-06-07 VITALS — BP 148/86 | HR 70 | Ht 67.0 in | Wt 183.3 lb

## 2021-06-07 DIAGNOSIS — I700 Atherosclerosis of aorta: Secondary | ICD-10-CM | POA: Diagnosis not present

## 2021-06-07 DIAGNOSIS — E785 Hyperlipidemia, unspecified: Secondary | ICD-10-CM

## 2021-06-07 DIAGNOSIS — Z720 Tobacco use: Secondary | ICD-10-CM | POA: Diagnosis not present

## 2021-06-07 DIAGNOSIS — I10 Essential (primary) hypertension: Secondary | ICD-10-CM | POA: Diagnosis not present

## 2021-06-07 MED ORDER — HYDROCHLOROTHIAZIDE 25 MG PO TABS: 25.0000 mg | ORAL_TABLET | Freq: Every day | ORAL | 3 refills | Status: DC

## 2021-06-07 NOTE — Patient Instructions (Signed)
Medication Instructions:  Your physician has recommended you make the following change in your medication:   Start: Hydrochlorothiazide (Hydrodiuril) 25mg  tablet daily   *If you need a refill on your cardiac medications before your next appointment, please call your pharmacy*   Lab Work: Please return for Lab work In 2 weeks. You may come to the...   Location manager (3rd floor) [address], [place], [place] at [place]- Any location  If you have labs (blood work) drawn today and your tests are completely normal, you will receive your results only by: [employer] (if you have MyChart) OR A paper copy in the mail If you have any lab test that is abnormal or we need to change your treatment, we will call you to review the results.   Testing/Procedures: None ordered today    Follow-Up: At [facility], you and your health needs are our priority.  As part of our continuing mission to provide you with exceptional heart care, we have created designated Provider Care Teams.  These Care Teams include your primary Cardiologist (physician) and Advanced Practice Providers (APPs -  Physician Assistants and Nurse Practitioners) who all work together to provide you with the care you need, when you need it.  We recommend signing up for the patient portal called "MyChart".  Sign up information is provided on this After Visit Summary.  MyChart is used to connect with patients for Virtual Visits (Telemedicine).  Patients are able to view lab/test results, encounter notes, upcoming appointments, etc.  Non-urgent messages can be sent to your provider as well.   To learn more about what you can do with MyChart, go to [email or web address].    Your next appointment:   3-4 month(s)  The format for your next appointment:   In Person  Provider:   Skeet Latch, MD, Laurann Montana, NP, or Coletta Memos, NP    Other Instructions  Please take your blood pressure each day and keep a log. Please report Blood pressures consistently greater than 130/80    Tips to Measure your Blood Pressure Correctly  To determine whether you have hypertension, a medical professional will take a blood pressure reading. How you prepare for the test, the position of your arm, and other factors can change a blood pressure reading by 10% or more. That could be enough to hide high blood pressure, start you on a drug you don't really need, or lead your doctor to incorrectly adjust your medications.  National and international guidelines offer specific instructions for measuring blood pressure. If a doctor, nurse, or medical assistant isn't doing it right, don't hesitate to ask him or her to get with the guidelines.  Here's what you can do to ensure a correct reading:  Don't drink a caffeinated beverage or smoke during the 30 minutes before the test.  Sit quietly for five minutes before the test begins.  During the measurement, sit in a chair with your feet on the floor and your arm supported so your elbow is at about heart level.  The inflatable part of the cuff should completely cover at least 80% of your upper arm, and the cuff should be placed on bare skin, not over a shirt.  Don't talk during the measurement.  Have your blood pressure measured twice, with a brief break in between. If the readings are different by 5 points or more, have it done a third  time.  In 2017, new guidelines from the [place], the [employer] of Cardiology, and nine other health organizations lowered the diagnosis of high blood pressure to 130/80 mm Hg or higher for all adults. The guidelines also redefined the various blood pressure categories to now include normal, elevated, Stage 1 hypertension, Stage 2 hypertension, and hypertensive crisis (see "Blood pressure categories").  Blood pressure categories   Blood pressure category SYSTOLIC (upper number)  DIASTOLIC (lower number)  Normal Less than 120 mm Hg and Less than 80 mm Hg  Elevated 120-129 mm Hg and Less than 80 mm Hg  High blood pressure: Stage 1 hypertension 130-139 mm Hg or 80-89 mm Hg  High blood pressure: Stage 2 hypertension 140 mm Hg or higher or 90 mm Hg or higher  Hypertensive crisis (consult your doctor immediately) Higher than 180 mm Hg and/or Higher than 120 mm Hg  Source: American Heart Association and American Stroke Association. For more on getting your blood pressure under control, buy Controlling Your Blood Pressure, a Special Health Report from [facility].   Blood Pressure Log   Date   Time  Blood Pressure  Position  Example: Nov 1 9 AM 124/78 sitting

## 2021-06-21 LAB — BASIC METABOLIC PANEL
BUN/Creatinine Ratio: 15 (ref 10–24)
BUN: 19 mg/dL (ref 8–27)
CO2: 23 mmol/L (ref 20–29)
Calcium: 9.3 mg/dL (ref 8.6–10.2)
Chloride: 98 mmol/L (ref 96–106)
Creatinine, Ser: 1.3 mg/dL — ABNORMAL HIGH (ref 0.76–1.27)
Glucose: 112 mg/dL — ABNORMAL HIGH (ref 70–99)
Potassium: 4.3 mmol/L (ref 3.5–5.2)
Sodium: 134 mmol/L (ref 134–144)
eGFR: 59 mL/min/{1.73_m2} — ABNORMAL LOW (ref >59)

## 2021-06-22 MED ORDER — AMLODIPINE BESYLATE 5 MG PO TABS: 5.0000 mg | ORAL_TABLET | Freq: Every day | ORAL | 3 refills | Status: DC

## 2021-06-22 NOTE — Addendum Note (Signed)
Addended by: Gerald Stabs on: 06/22/2021 08:24 AM   Modules accepted: Orders

## 2021-06-22 NOTE — Progress Notes (Signed)
Spoke with patient regarding lab results. Patient is going to stop HCTZ, endorses it has been giving his muscle aches and cramps. He is very happy to be stopping this medication. He is agreeable to starting Amlodipine 5mg  and has been sent to his preferred pharmacy. He will also return in 2 weeks for repeat lab work. Slips mailed to patient.

## 2021-06-29 ENCOUNTER — Encounter (HOSPITAL_BASED_OUTPATIENT_CLINIC_OR_DEPARTMENT_OTHER): Payer: Self-pay | Admitting: Cardiovascular Disease

## 2021-11-01 LAB — BASIC METABOLIC PANEL
BUN/Creatinine Ratio: 14 (ref 10–24)
BUN: 13 mg/dL (ref 8–27)
CO2: 22 mmol/L (ref 20–29)
Calcium: 9 mg/dL (ref 8.6–10.2)
Chloride: 101 mmol/L (ref 96–106)
Creatinine, Ser: 0.92 mg/dL (ref 0.76–1.27)
Glucose: 104 mg/dL — ABNORMAL HIGH (ref 70–99)
Potassium: 4.2 mmol/L (ref 3.5–5.2)
Sodium: 138 mmol/L (ref 134–144)
eGFR: 89 mL/min/{1.73_m2} (ref >59)

## 2021-11-02 ENCOUNTER — Telehealth: Payer: Self-pay

## 2021-11-02 NOTE — Telephone Encounter (Signed)
Spoke with pt. Pt was notified of lab results and will follow up as planned.  

## 2021-11-09 ENCOUNTER — Encounter (HOSPITAL_BASED_OUTPATIENT_CLINIC_OR_DEPARTMENT_OTHER): Payer: Self-pay | Admitting: Cardiovascular Disease

## 2021-11-09 NOTE — Telephone Encounter (Signed)
Dr. Oval Linsey patient, out of office, please advise

## 2022-02-13 NOTE — Telephone Encounter (Signed)
Previously addressed. Pt with multiple side effects to multiple medications. F/u as scheduled 03/2022 for further treatment of HTN.   Loel Dubonnet, NP

## 2022-04-04 ENCOUNTER — Encounter (HOSPITAL_BASED_OUTPATIENT_CLINIC_OR_DEPARTMENT_OTHER): Payer: Self-pay | Admitting: Cardiovascular Disease

## 2022-04-04 ENCOUNTER — Ambulatory Visit (HOSPITAL_BASED_OUTPATIENT_CLINIC_OR_DEPARTMENT_OTHER): Payer: PPO | Admitting: Cardiovascular Disease

## 2022-04-04 DIAGNOSIS — E7800 Pure hypercholesterolemia, unspecified: Secondary | ICD-10-CM

## 2022-04-04 DIAGNOSIS — I10 Essential (primary) hypertension: Secondary | ICD-10-CM

## 2022-04-04 MED ORDER — SPIRONOLACTONE 25 MG PO TABS: 25.0000 mg | ORAL_TABLET | Freq: Every day | ORAL | 1 refills | Status: DC

## 2022-04-04 MED ORDER — LOSARTAN POTASSIUM 50 MG PO TABS: 50.0000 mg | ORAL_TABLET | Freq: Two times a day (BID) | ORAL | 1 refills | Status: DC

## 2022-04-04 NOTE — Progress Notes (Signed)
Cardiology Office Note   Date:  04/04/2022   ID:  Johnny Barnes, DOB [birth year], MRN [id number]  PCP:  Patient, No Pcp Per  Cardiologist:   Skeet Latch, MD   No chief complaint on file.   History of Present Illness: Johnny Barnes is a 71 y.o. male with hypertension and hyperlipidemia who presents for follow up.  He has known that his blood presure was high for 15-20 years.  Johnny Barnes noted chest discomfort and underwent nuclear stress testing 05/11/15 that was negative for ischemia. It did show a hypertensive response to exercise.  He was started on losartan and we recommended lifestyle interventions.  He previously drank alcohol heavily but started to abstain from alcohol. Cholesterol levels were checked after 3 months of lifestyle interventions. He started Atorvastatin and his cholesterol has been well controlled. He initially had mild myalgias but these have improved. Johnny Barnes blood pressure was above goal. Losartan was increased.  He also reported intermittent episodes of R eye vision changes.  He was referred to his ophthalmologist and had carotid Dopplers 11/2016 that showed no obstruction.  Rosuvastatin was held due to GI upset.  He previously did not tolerate atorvastatin due to myalgias.  He tried to restart the rosuvastatin but again had the GI upset.  He followed up with our pharmacist who recommended that he start Livalo.  However he had an appointment scheduled with the gastroenterologist 11/2 and wanted to wait till after that appointment to start it.  He did note that his blood pressure tends to run higher in the morning and then seems to be at goal in the evenings.  Our pharmacist recommended that he split his losartan to 50 mg in the morning and 20 5 in the evening. At his last visit he was deciding whether or not to restart a statin. He wanted input from his GI doctor.  At the last visit blood pressures were uncontrolled and losartan was increased. He wanted  to stop HCTZ due to cramps. Amlodipine was added. At follow up 05/2021 blood pressure remained above goal. He was advised to limit alcohol use and HCTZ was restarted. He stopped taking amlodipine due to diarrhea.  Today, he has been having difficulty with his blood pressure still. After he stopped taking the HCTZ he had an ache in his chest for about 2 weeks. It is no longer hurting him. He has not been exercising a lot. He wondered if he could split his losartan dose and take it half a dose BID instead of one dose once a day. He has been eating at home most of the time. He tries to limit his salt intake. He has not been able to limit his alcohol to 2 drinks at time, though he has not had a drink since Monday. He reports that he has had a lot of stress due to the passing of his brothers that has made it difficult to remain abstinent.   He denies any palpitations, shortness of breath, or peripheral edema. No lightheadedness, headaches, syncope, orthopnea, or PND.    Past Medical History:  Diagnosis Date   Chest pain, precordial 05/04/2015   Essential hypertension 05/04/2015   Hyperlipidemia 06/13/2015   Leg pain 10/07/2019    No past surgical history on file.   Current Outpatient Medications  Medication Sig Dispense Refill   finasteride (PROSCAR) 5 MG tablet Take 1 tablet by mouth daily.     spironolactone (ALDACTONE) 25 MG tablet Take 1 tablet (25 mg total)  by mouth daily. 90 tablet 1   tamsulosin (FLOMAX) 0.4 MG CAPS capsule Take 0.4 mg by mouth.     losartan (COZAAR) 50 MG tablet Take 1 tablet (50 mg total) by mouth 2 (two) times daily. 180 tablet 1   No current facility-administered medications for this visit.    Allergies:   Amlodipine, Crestor [rosuvastatin calcium], Crestor [rosuvastatin], Hydrochlorothiazide, Lipitor [atorvastatin], Livalo [pitavastatin], and Aspirin   Social History:  The patient  reports that he has been smoking cigars. He has never used smokeless tobacco. He  reports current alcohol use. He reports that he does not use drugs.   Family History:  The patient's family history includes Lung cancer in his father; Ovarian cancer in his sister.    ROS:   Please see the history of present illness. (+) chest pain (ache) (+) stress All other systems are reviewed and negative.    PHYSICAL EXAM: VS:  BP (!) 168/94 (BP Location: Right Arm, Patient Position: Sitting, Cuff Size: Normal)   Pulse 62   Ht '5\' 7"'$  (1.702 m)   Wt 182 lb (82.6 kg)   BMI 28.51 kg/m  , BMI Body mass index is 28.51 kg/m. GENERAL:  Well appearing HEENT: Pupils equal round and reactive, fundi not visualized, oral mucosa unremarkable NECK:  No jugular venous distention, waveform within normal limits, carotid upstroke brisk and symmetric, no bruits LUNGS:  Clear to auscultation bilaterally HEART:  RRR.  PMI not displaced or sustained,S1 and S2 within normal limits, no S3, no S4, no clicks, no rubs, no murmurs ABD:  Flat, positive bowel sounds normal in frequency in pitch, no bruits, no rebound, no guarding, no midline pulsatile mass, no hepatomegaly, no splenomegaly EXT:  2 plus pulses throughout, no edema, no cyanosis no clubbing SKIN:  No rashes no nodules NEURO:  Cranial nerves II through XII grossly intact, motor grossly intact throughout PSYCH:  Cognitively intact, oriented to person place and time   EKG:   04/04/2022:Sinus rhythm. Rate 62 bpm. RBBB. 06/12/16: Sinus rhythm.  Rate 65 bpm.  RBBB.  12/09/16: Sinus bradycardia.  Rate 59 bpm.  RBBB.  06/09/17: Sinus rhythm.  Rate 61 bpm.  RBBB.  07/09/18: Sinus rhythm.  Rate 67 bpm.  RBBB.  10/07/19: Sinus rhythm.  Rate 67 bpm.  RBBB.  LPFB. 04/13/2020: Sinus rhythm.  Rate 64 bpm.  Right bundle branch block.  Left posterior fascicular block. 04/09/2021: Sinus rhythm. Rate 81 bpm. RBBB.  Carotid Duplex 12/23/2016: Technologist Notes: Duplex imaging of the carotid arteries reveals smooth vessel walls, without plaque formation or  flow reduction. ICA velocities are normal, bilaterally. The vertebral arteries are patent with antegrade flow, bilaterally. The subclavian arteries are widely patent, with normal velocity flow.  Impressions: Normal carotid arteries, bilaterally. Patent vertebral arteries with antegrade flow. Normal subclavian arteries, bilaterally.  Exercise Cardiolite 05/11/15:  Nuclear stress EF: 58%. The left ventricular ejection fraction is normal (55-65%). Blood pressure demonstrated a hypertensive response to exercise. There was no ST segment deviation noted during stress. No T wave inversion was noted during stress.   Small size and intensity, fixed apical septal and apical inferior wall defect, likely artifact from RBBB. No reversible ischemia. LVEF 58%. This is a low risk study.  Recent Labs: 04/10/2021: ALT 13 10/31/2021: BUN 13; Creatinine, Ser 0.92; Potassium 4.2; Sodium 138    Lipid Panel    Component Value Date/Time   CHOL 201 (H) 04/10/2021 0840   TRIG 69 04/10/2021 0840   HDL 77 04/10/2021 0840  CHOLHDL 2.6 04/10/2021 0840   CHOLHDL 1.8 07/26/2016 0846   VLDL 9 07/26/2016 0846   LDLCALC 112 (H) 04/10/2021 0840      Wt Readings from Last 3 Encounters:  04/04/22 182 lb (82.6 kg)  06/07/21 183 lb 4.8 oz (83.1 kg)  04/09/21 183 lb (83 kg)     ASSESSMENT AND PLAN: Essential hypertension Blood pressure continues to be above goal.  He is not interested in increasing his exercise.  He is doing good job of cleaning at home and not adding salt.  He is also cutting back on alcohol intake.  He is congratulated for this.  Recommend trying spironolactone 25 mg daily.  Check a CMP in a week.  We discussed checking his blood pressures at home but he does not think that his machines are accurate.  Hyperlipidemia He will come back for fasting lipids and a CMP.  He is struggled with intolerance to statins.  Continue focusing on diet and exercise and consider Zetia.    Current  medicines are reviewed at length with the patient today.  The patient does not have concerns regarding medicines.  The following changes have been made:  Increase losartan to '100mg'$ .   Labs/ tests ordered today include:   Orders Placed This Encounter  Procedures   Lipid panel   Comprehensive metabolic panel   EKG 23-JSEG    Disposition:    FU with APP in 1-2 months. FU with Yanet Balliet C. Oval Linsey, MD in 1 year.   I,Jessica Ford,acting as a Education administrator for [place], MD.,have documented all relevant documentation on the behalf of Skeet Latch, MD,as directed by  Skeet Latch, MD while in the presence of Skeet Latch, MD.   I, [place] Oval Linsey, MD have reviewed all documentation for this visit.  The documentation of the exam, diagnosis, procedures, and orders on 04/04/2022 are all accurate and complete.    Signed, Skeet Latch, MD  04/04/2022 11:12 AM    [employer]

## 2022-04-04 NOTE — Patient Instructions (Addendum)
Medication Instructions:  START SPIROLACTONE 25  MG DAILY     *If you need a refill on your cardiac medications before your next appointment, please call your pharmacy*  Lab Work: FASTING LP/CMET IN 1 WEEK   If you have labs (blood work) drawn today and your tests are completely normal, you will receive your results only by: [place] (if you have MyChart) OR A paper copy in the mail If you have any lab test that is abnormal or we need to change your treatment, we will call you to review the results.  Testing/Procedures: NONE   Follow-Up: At [facility], you and your health needs are our priority.  As part of our continuing mission to provide you with exceptional heart care, we have created designated Provider Care Teams.  These Care Teams include your primary Cardiologist (physician) and Advanced Practice Providers (APPs -  Physician Assistants and Nurse Practitioners) who all work together to provide you with the care you need, when you need it.  We recommend signing up for the patient portal called "MyChart".  Sign up information is provided on this After Visit Summary.  MyChart is used to connect with patients for Virtual Visits (Telemedicine).  Patients are able to view lab/test results, encounter notes, upcoming appointments, etc.  Non-urgent messages can be sent to your provider as well.   To learn more about what you can do with MyChart, go to [email or web address].    Your next appointment:   1-2  month(s)  The format for your next appointment:   In Person  Provider:   Laurann Montana, NP or [facility]     Your physician wants you to follow-up in: DR Belleair Shore will receive a reminder letter in the mail two months in advance. If you don't receive a letter, please call our office to schedule the follow-up appointment.

## 2022-04-04 NOTE — Assessment & Plan Note (Signed)
He will come back for fasting lipids and a CMP.  He is struggled with intolerance to statins.  Continue focusing on diet and exercise and consider Zetia.

## 2022-04-04 NOTE — Assessment & Plan Note (Addendum)
Blood pressure continues to be above goal.  He is not interested in increasing his exercise.  He is doing good job of cleaning at home and not adding salt.  He is also cutting back on alcohol intake.  He is congratulated for this.  Recommend trying spironolactone 25 mg daily.  Check a CMP in a week.  We discussed checking his blood pressures at home but he does not think that his machines are accurate.

## 2022-04-15 ENCOUNTER — Telehealth (HOSPITAL_BASED_OUTPATIENT_CLINIC_OR_DEPARTMENT_OTHER): Payer: Self-pay | Admitting: *Deleted

## 2022-04-15 ENCOUNTER — Encounter (HOSPITAL_BASED_OUTPATIENT_CLINIC_OR_DEPARTMENT_OTHER): Payer: Self-pay | Admitting: Cardiovascular Disease

## 2022-04-15 NOTE — Telephone Encounter (Signed)
Johnny Barnes to Johnny Barnes Triage (supporting Skeet Latch, MD)   MA       04/15/22  1:00 PM Not going to take Spironolactone. Death is listed in the Side Effects   Blood pressuce from 10-15 to 10-20 with Losartan twice a day a little high late morning and w/o SLEEP 10-15 AM 151          No data 10-16         149         No data 10-17         165  PM 133 and 137      sleep 10-18         156                           137 10-19         161                           134      sleep 10-20         142                           138   SLEEP DEPRIVATION seems to increase my Blood pressure. On my annual 10-12 visit I only got 4 hrs sleep    I willprobably get my blood test this week  Above mychart message received, will forward to Dr Oval Linsey for review

## 2022-04-17 ENCOUNTER — Telehealth (HOSPITAL_BASED_OUTPATIENT_CLINIC_OR_DEPARTMENT_OTHER): Payer: Self-pay

## 2022-04-17 DIAGNOSIS — E7800 Pure hypercholesterolemia, unspecified: Secondary | ICD-10-CM

## 2022-04-17 DIAGNOSIS — E785 Hyperlipidemia, unspecified: Secondary | ICD-10-CM

## 2022-04-17 LAB — COMPREHENSIVE METABOLIC PANEL
ALT: 14 IU/L (ref 0–44)
AST: 17 IU/L (ref 0–40)
Albumin/Globulin Ratio: 2.1 (ref 1.2–2.2)
Albumin: 4.8 g/dL (ref 3.9–4.9)
Alkaline Phosphatase: 60 IU/L (ref 44–121)
BUN/Creatinine Ratio: 14 (ref 10–24)
BUN: 15 mg/dL (ref 8–27)
Bilirubin Total: 0.8 mg/dL (ref 0.0–1.2)
CO2: 21 mmol/L (ref 20–29)
Calcium: 9.5 mg/dL (ref 8.6–10.2)
Chloride: 104 mmol/L (ref 96–106)
Creatinine, Ser: 1.05 mg/dL (ref 0.76–1.27)
Globulin, Total: 2.3 g/dL (ref 1.5–4.5)
Glucose: 95 mg/dL (ref 70–99)
Potassium: 4.3 mmol/L (ref 3.5–5.2)
Sodium: 140 mmol/L (ref 134–144)
Total Protein: 7.1 g/dL (ref 6.0–8.5)
eGFR: 76 mL/min/{1.73_m2} (ref >59)

## 2022-04-17 LAB — LIPID PANEL
Chol/HDL Ratio: 2.6 ratio (ref 0.0–5.0)
Cholesterol, Total: 228 mg/dL — ABNORMAL HIGH (ref 100–199)
HDL: 89 mg/dL (ref >39)
LDL Chol Calc (NIH): 126 mg/dL — ABNORMAL HIGH (ref 0–99)
Triglycerides: 78 mg/dL (ref 0–149)
VLDL Cholesterol Cal: 13 mg/dL (ref 5–40)

## 2022-04-17 MED ORDER — EZETIMIBE 10 MG PO TABS: 10.0000 mg | ORAL_TABLET | Freq: Every day | ORAL | 3 refills | Status: DC

## 2022-04-17 NOTE — Telephone Encounter (Addendum)
Seen by patient Johnny Barnes on 04/17/2022  4:09 PM; Follow up mychart message sent to patient. Rx to pharm and labs mailed to patient.    ----- Message from Loel Dubonnet, NP sent at 04/17/2022  4:06 PM EDT ----- Normal kidneys, liver, electrolytes. Cholesterol elevated. The 10-year ASCVD risk score (Arnett DK, et al., 2019) is: 31.9%. Previous intolerance to statin. Recommend Zetia '10mg'$  daily with FLP/LFT in 3 months.

## 2022-04-30 NOTE — Telephone Encounter (Signed)
Skeet Latch, MD  to Me     04/29/22  5:51 AM Every blood pressure he sent is above goal, with or without sleep.  His blood pressure should be <130/80.  Not sure where he is seeing death as a side effect of spironolactone.  Maybe if he didn't come back blood work in a week as we requested and he had hyperkalemia.  He can try switching losartan to valsartan 160 mg bid or add hydralazine '25mg'$  bid.  TCR  Left message to call back

## 2022-05-05 NOTE — Telephone Encounter (Signed)
His multiple concerns regarding medications cannot be adequately addressed via MyChart. Recommend he be scheduled for OV with Dr. Oval Linsey, APP, or PharmD to discuss. Last seen 03/2022 recommended for 1-2 months follow up.   Loel Dubonnet, NP

## 2022-05-07 NOTE — Telephone Encounter (Signed)
See 10/23 mychart messages

## 2022-06-03 ENCOUNTER — Ambulatory Visit (HOSPITAL_BASED_OUTPATIENT_CLINIC_OR_DEPARTMENT_OTHER): Payer: PPO | Admitting: Family

## 2022-10-14 ENCOUNTER — Other Ambulatory Visit (HOSPITAL_BASED_OUTPATIENT_CLINIC_OR_DEPARTMENT_OTHER): Payer: Self-pay | Admitting: Cardiovascular Disease

## 2022-10-14 NOTE — Telephone Encounter (Signed)
Rx(s) sent to pharmacy electronically.  

## 2023-04-10 ENCOUNTER — Ambulatory Visit (HOSPITAL_BASED_OUTPATIENT_CLINIC_OR_DEPARTMENT_OTHER): Payer: PPO | Admitting: Family

## 2023-04-10 ENCOUNTER — Encounter (HOSPITAL_BASED_OUTPATIENT_CLINIC_OR_DEPARTMENT_OTHER): Payer: Self-pay | Admitting: Family

## 2023-04-10 VITALS — BP 156/90 | HR 62 | Ht 67.0 in | Wt 181.6 lb

## 2023-04-10 DIAGNOSIS — E782 Mixed hyperlipidemia: Secondary | ICD-10-CM | POA: Diagnosis not present

## 2023-04-10 DIAGNOSIS — E785 Hyperlipidemia, unspecified: Secondary | ICD-10-CM | POA: Diagnosis not present

## 2023-04-10 DIAGNOSIS — Z720 Tobacco use: Secondary | ICD-10-CM

## 2023-04-10 DIAGNOSIS — I10 Essential (primary) hypertension: Secondary | ICD-10-CM | POA: Diagnosis not present

## 2023-04-10 DIAGNOSIS — I700 Atherosclerosis of aorta: Secondary | ICD-10-CM | POA: Diagnosis not present

## 2023-04-10 MED ORDER — LOSARTAN POTASSIUM 50 MG PO TABS: 50.0000 mg | ORAL_TABLET | Freq: Two times a day (BID) | ORAL | 1 refills | Status: DC

## 2023-04-10 NOTE — Progress Notes (Signed)
Cardiology Office Note:  .   Date:  04/10/2023  ID:  Johnny Barnes, DOB 12-14-1950, MRN [id number] PCP: Patient, No Pcp Per  [facility] Providers Cardiologist:  Chilton Si, MD    History of Present Illness: Johnny Barnes   Johnny Barnes is a 72 y.o. male with hx of HTN, HLD, aortic atherosclerosis.   Prior myoview 05/11/15 negative for ischemia. Did show hypertensive response to exercise. He was started on Losartan. Later increased. Carotid doppler 11/2016 no obstruction. Prior intolerance Atorvastatin, Rosuvastatin and was transitioned to Livalo. Hydrochlorothiazide previously stopped due to cramps and Amlodipine added. Later did not tolerate Amlodipine and it was stopped.   Last seen 04/04/22 with BP above goal. Spironolactone 25mg  daily initiated, but he did not start. He was recommended to take Zetia but did not start due to concerns regarding side effects.   Presents today for follow up. Eating mostly at home. No formal exercise routine. Has not been checking BP at home routinely, rare readings 140s/70s. He does have arm cuff. Reports no shortness of breath nor dyspnea on exertion. Reports no chest pain, pressure, or tightness. No edema, orthopnea, PND. Reports no palpitations.  Not interested in cholesterol lowering medications - concerns about diarrhea, myalgias. Previously declined referral to lipid clinic, PCSK9i.  ROS: Please see the history of present illness.    All other systems reviewed and are negative.   Studies Reviewed: Johnny Barnes   EKG Interpretation Date/Time:  Thursday April 10 2023 15:11:22 EDT Ventricular Rate:  62 PR Interval:  186 QRS Duration:  140 QT Interval:  450 QTC Calculation: 456 R Axis:   102  Text Interpretation: Normal sinus rhythm Right bundle branch block Stable compared to previous Confirmed by Gillian Shields ([id number]) on 04/10/2023 3:32:33 PM    Cardiac Studies & Procedures     STRESS TESTS  MYOCARDIAL PERFUSION IMAGING  05/11/2015  Narrative  Nuclear stress EF: 58%.  The left ventricular ejection fraction is normal (55-65%).  Blood pressure demonstrated a hypertensive response to exercise.  There was no ST segment deviation noted during stress.  No T wave inversion was noted during stress.  Small size and intensity, fixed apical septal and apical inferior wall defect, likely artifact from RBBB. No reversible ischemia. LVEF 58%. This is a low risk study.              Risk Assessment/Calculations:     HYPERTENSION CONTROL Vitals:   04/10/23 1456 04/10/23 1543  BP: (!) 148/85 (!) 156/90    The patient's blood pressure is elevated above target today.  In order to address the patient's elevated BP: Follow up with general cardiology has been recommended. (Declines medication changes. Reiterated risk of uncontrolled hypertension.)          Physical Exam:   VS:  BP (!) 156/90   Pulse 62   Ht 5\' 7"  (1.702 m)   Wt 181 lb 9.6 oz (82.4 kg)   SpO2 96%   BMI 28.44 kg/m    Wt Readings from Last 3 Encounters:  04/10/23 181 lb 9.6 oz (82.4 kg)  04/04/22 182 lb (82.6 kg)  06/07/21 183 lb 4.8 oz (83.1 kg)    GEN: Well nourished, well developed in no acute distress NECK: No JVD; No carotid bruits CARDIAC: RRR, no murmurs, rubs, gallops RESPIRATORY:  Clear to auscultation without rales, wheezing or rhonchi  ABDOMEN: Soft, non-tender, non-distended EXTREMITIES:  No edema; No deformity   ASSESSMENT AND PLAN: .    HTN - BP not at  goal <130/80. Education provided  on risks of uncontrolled hypertension. Previous intolerance to hydrochlorothiazide, Amlodipine. Reports he did not start Spironolactone as previously recommended as read potential side effect of "death" and was concerned. Reassurance provided regarding safety of Spironolactone. He declines medications today preferring to continue Losartan 50mg  BID. He was given information on Spironolactone, Doxazosin or transition to Olmesartan to consider.  Update CMP, CBC today.   Tobacco use - 3 cigars per night but reports he "does not inhale" and beer. Recommend reduced tobacco and alcohol intake.   Diarrhea - Side effect from medications in multiple classes with diarrhea. Given persistence, low suspicion it is related to medication. Some GI upset today which he attributes to tomatoes eaten for lunch. Recommend establishing with PCP and/or GI.   Aortic atherosclerosis / HLD, LDL goal <70 - Previous intolerance to Livalo, Atorvastatin, Rosuvastatin. Reiterated risk of uncontrolled cholesterol. The 10-year ASCVD risk score (Arnett DK, et al., 2019) is: 30%. He declines Zetia due to concerns regarding possible side effect of diarrhea. Has previously declined referral to lipid clinic, PCSK9i. Update CMP, direct LDL today.        Dispo: follow up in 6 months  Signed, Alver Sorrow, NP

## 2023-04-10 NOTE — Patient Instructions (Addendum)
Medication Instructions:  Your physician recommends that you continue on your current medications as directed. Please refer to the Current Medication list given to you today.  Please consider Olmesartan, Doxazosin or Spironolactone for future blood pressure control    Labwork: Your physician recommends that you return for lab work today- CMP, CBC, Direct LDL   Follow-Up: 5-6 months in [facility] with Dr. Duke Salvia

## 2023-04-11 ENCOUNTER — Telehealth (HOSPITAL_BASED_OUTPATIENT_CLINIC_OR_DEPARTMENT_OTHER): Payer: Self-pay

## 2023-04-11 LAB — LDL CHOLESTEROL, DIRECT: LDL Direct: 102 mg/dL — ABNORMAL HIGH (ref 0–99)

## 2023-04-11 LAB — CBC
Hematocrit: 46.5 % (ref 37.5–51.0)
Hemoglobin: 15.7 g/dL (ref 13.0–17.7)
MCH: 31.8 pg (ref 26.6–33.0)
MCHC: 33.8 g/dL (ref 31.5–35.7)
MCV: 94 fL (ref 79–97)
Platelets: 240 10*3/uL (ref 150–450)
RBC: 4.93 x10E6/uL (ref 4.14–5.80)
RDW: 12.3 % (ref 11.6–15.4)
WBC: 7.6 10*3/uL (ref 3.4–10.8)

## 2023-04-11 LAB — BASIC METABOLIC PANEL
BUN/Creatinine Ratio: 15 (ref 10–24)
BUN: 16 mg/dL (ref 8–27)
CO2: 20 mmol/L (ref 20–29)
Calcium: 8.9 mg/dL (ref 8.6–10.2)
Chloride: 103 mmol/L (ref 96–106)
Creatinine, Ser: 1.08 mg/dL (ref 0.76–1.27)
Glucose: 84 mg/dL (ref 70–99)
Potassium: 4.4 mmol/L (ref 3.5–5.2)
Sodium: 139 mmol/L (ref 134–144)
eGFR: 73 mL/min/{1.73_m2} (ref >59)

## 2023-04-11 NOTE — Telephone Encounter (Addendum)
Seen by patient Johnny Barnes on 04/11/2023  9:38 AM; follow up mychart message sent to patient.   ----- Message from Alver Sorrow sent at 04/11/2023  7:52 AM EDT ----- CBC with no evidence of anemia nor infection.  Stable kidney function. Normal electrolytes. LDL (bad cholesterol) of 102 which is not at goal of less than 70. His 10-year ASCVD risk score  is: 30%.   Declined additional cholesterol lowering medications in clinic. If he is agreeable, would recommend trial of Zetia. He could start at 3 times per week. In the research study those on Zetia got diarrhea 4.1% of the time whereas those on placebo (not on Zetia) got diarrhea 3.7% of the time. So overall very uncommon.  If he is not agreeable to start, can offer referral to lipid clinic. If he declines that, just f/u in 6 mos with Dr. Duke Salvia

## 2023-10-08 ENCOUNTER — Other Ambulatory Visit (HOSPITAL_BASED_OUTPATIENT_CLINIC_OR_DEPARTMENT_OTHER): Payer: Self-pay | Admitting: Family

## 2023-10-08 DIAGNOSIS — I10 Essential (primary) hypertension: Secondary | ICD-10-CM

## 2024-04-08 ENCOUNTER — Encounter (HOSPITAL_BASED_OUTPATIENT_CLINIC_OR_DEPARTMENT_OTHER): Payer: Self-pay | Admitting: Cardiovascular Disease

## 2024-04-08 ENCOUNTER — Ambulatory Visit (HOSPITAL_BASED_OUTPATIENT_CLINIC_OR_DEPARTMENT_OTHER): Admitting: Cardiovascular Disease

## 2024-04-08 DIAGNOSIS — I10 Essential (primary) hypertension: Secondary | ICD-10-CM | POA: Diagnosis not present

## 2024-04-08 DIAGNOSIS — E7800 Pure hypercholesterolemia, unspecified: Secondary | ICD-10-CM

## 2024-04-08 NOTE — Patient Instructions (Signed)
 Medication Instructions:  Your physician recommends that you continue on your current medications as directed. Please refer to the Current Medication list given to you today.   Labwork: NONE  Testing/Procedures: NONE  Follow-Up: 1 YEAR WITH DR [place] OR [name] NP   If you need a refill on your cardiac medications before your next appointment, please call your pharmacy.

## 2024-04-08 NOTE — Progress Notes (Signed)
 Cardiology Office Note:  .   Date:  04/27/2024  ID:  Johnny Barnes, DOB Dec 05, 1950, MRN [id number] PCP: Patient, No Pcp Per  [facility] Providers Cardiologist:  Annabella Scarce, MD    History of Present Illness: Johnny Barnes    Johnny Barnes is a 73 y.o. male with hypertension and hyperlipidemia who presents for follow up.  He has known that his blood presure was high for 15-20 years.  Johnny Barnes noted chest discomfort and underwent nuclear stress testing 05/11/15 that was negative for ischemia. It did show a hypertensive response to exercise.  He was started on losartan  and we recommended lifestyle interventions.  He previously drank alcohol heavily but started to abstain from alcohol. Cholesterol levels were checked after 3 months of lifestyle interventions. He started Atorvastatin  and his cholesterol has been well controlled. He initially had mild myalgias but these have improved. Johnny Barnes blood pressure was above goal. Losartan  was increased.  He also reported intermittent episodes of R eye vision changes.  He was referred to his ophthalmologist and had carotid Dopplers 11/2016 that showed no obstruction.   Rosuvastatin  was held due to GI upset.  He previously did not tolerate atorvastatin  due to myalgias.  He tried to restart the rosuvastatin  but again had the GI upset.  He followed up with our pharmacist who recommended that he start Livalo.  However he had an appointment scheduled with the gastroenterologist 11/2 and wanted to wait till after that appointment to start it.  He did note that his blood pressure tends to run higher in the morning and then seems to be at goal in the evenings.  Our pharmacist recommended that he split his losartan  to 50 mg in the morning and 20 5 in the evening. At his last visit he was deciding whether or not to restart a statin. He wanted input from his GI doctor.   BP remained uncontrolled and losartan  was increased. He wanted to stop HCTZ due to cramps.  Amlodipine  was added. At follow up 05/2021 blood pressure remained above goal. He was advised to limit alcohol use and HCTZ was restarted. He stopped taking amlodipine  due to diarrhea.  At his visit 03/2022 BP was elevated and he was under stress.  EtOH increased in the setting of his brother passing. He wasn't interested in increasing exercise.  Spironolactone  was added.  He followed up with Reche Finder, NP 03/2023 and he hadn't started the spironolactone .  He read death is a possible side effect.  He declined additional medication changes.   Discussed the use of AI scribe software for clinical note transcription with the patient, who gave verbal consent to proceed.  History of Present Illness Johnny Barnes experiences elevated blood pressure in the morning, with readings such as 145/84 mmHg, which tend to decrease by the evening. It is usally in the 150s in the am and 140s in the afternoon.  He takes losartan  twice daily, approximately 12 hours apart, and is compliant with this regimen.  He experiences poor sleep, often waking up at night and struggling to fall back asleep, averaging five to six hours of sleep per night. He believes that lack of sleep contributes to his elevated blood pressure.  His physical activity is limited to yard work and occasional tasks like rotating car tires. He wants to acquire an exercise bike but has not yet done so. He used to [place] and play hockey but currently does not engage in regular cardiovascular exercise.  He takes Flomax and  finasteride for urinary symptoms related to benign prostatic hyperplasia. These medications were started after he experienced blood in his urine.  ROS:  As per HPI  Studies Reviewed: Johnny Barnes   EKG Interpretation Date/Time:  Thursday April 08 2024 11:29:50 EDT Ventricular Rate:  74 PR Interval:  180 QRS Duration:  144 QT Interval:  422 QTC Calculation: 468 R Axis:   106  Text Interpretation: Normal sinus rhythm Right bundle  branch block When compared with ECG of 10-Apr-2023 15:11, No significant change was found Confirmed by Raford Riggs ([id number]) on 04/08/2024 11:54:27 AM   Lexiscan Myoview 04/2015: Nuclear stress EF: 58%. The left ventricular ejection fraction is normal (55-65%). Blood pressure demonstrated a hypertensive response to exercise. There was no ST segment deviation noted during stress. No T wave inversion was noted during stress.   Small size and intensity, fixed apical septal and apical inferior wall defect, likely artifact from RBBB. No reversible ischemia. LVEF 58%. This is a low risk study.   Risk Assessment/Calculations:         Physical Exam:   VS:  BP (!) 144/80 (BP Location: Right Arm, Patient Position: Sitting, Cuff Size: Normal)   Pulse 74   Ht 5' 7 (1.702 m)   Wt 183 lb 14.4 oz (83.4 kg)   SpO2 97%   BMI 28.80 kg/m  , BMI Body mass index is 28.8 kg/m. GENERAL:  Well appearing HEENT: Pupils equal round and reactive, fundi not visualized, oral mucosa unremarkable NECK:  No jugular venous distention, waveform within normal limits, carotid upstroke brisk and symmetric, no bruits, no thyromegaly LUNGS:  Clear to auscultation bilaterally HEART:  RRR.  PMI not displaced or sustained,S1 and S2 within normal limits, no S3, no S4, no clicks, no rubs, no murmurs ABD:  Flat, positive bowel sounds normal in frequency in pitch, no bruits, no rebound, no guarding, no midline pulsatile mass, no hepatomegaly, no splenomegaly EXT:  2 plus pulses throughout, no edema, no cyanosis no clubbing SKIN:  No rashes no nodules NEURO:  Cranial nerves II through XII grossly intact, motor grossly intact throughout PSYCH:  Cognitively intact, oriented to person place and time   ASSESSMENT AND PLAN: .    Assessment & Plan # Primary hypertension Blood pressure improved with losartan , but remains variable. He is hesitant to start spironolactone  that was prescribed due to hyperkalemia risk.  He isn't  interested in starting any new medications.  - Continue losartan  twice daily. - Discuss lifestyle modifications, including increased physical activity.  # Right bundle branch block Long-standing, asymptomatic, no impact on longevity.  # Benign prostatic hyperplasia with lower urinary tract symptoms Flomax and finasteride effective in symptom management. - Continue Flomax and finasteride as prescribed.  # Chronic insomnia Difficulty with sleep onset and maintenance, potentially affecting blood pressure.   Dispo: f/u in 1 year  Signed, Riggs Raford, MD

## 2024-04-16 ENCOUNTER — Telehealth: Payer: Self-pay | Admitting: Cardiovascular Disease

## 2024-04-16 ENCOUNTER — Other Ambulatory Visit (HOSPITAL_BASED_OUTPATIENT_CLINIC_OR_DEPARTMENT_OTHER): Payer: Self-pay | Admitting: Family

## 2024-04-16 DIAGNOSIS — I10 Essential (primary) hypertension: Secondary | ICD-10-CM

## 2024-04-16 NOTE — Telephone Encounter (Signed)
*  STAT* If patient is at the pharmacy, call can be transferred to refill team.   1. Which medications need to be refilled? (please list name of each medication and dose if known) losartan  (COZAAR ) 50 MG tablet    2. Would you like to learn more about the convenience, safety, & potential cost savings by using the [facility]?     3. Are you open to using the [facility] (Type [facility].  ).   4. Which pharmacy/location (including street and city if local pharmacy) is medication to be sent to? [facility] - [place], [place] - [address]    5. Do they need a 30 day or 90 day supply? 90 day

## 2024-04-19 MED ORDER — LOSARTAN POTASSIUM 50 MG PO TABS: ORAL_TABLET | ORAL | 3 refills | Status: AC

## 2024-04-19 NOTE — Telephone Encounter (Signed)
 Pt's medication was sent to pt's pharmacy as requested. Confirmation received.

## 2024-04-27 ENCOUNTER — Encounter (HOSPITAL_BASED_OUTPATIENT_CLINIC_OR_DEPARTMENT_OTHER): Payer: Self-pay | Admitting: Cardiovascular Disease
